# Patient Record
Sex: Female | Born: 1985 | Race: White | Hispanic: No | Marital: Married | Smoking: Never smoker
Health system: Southern US, Community
[De-identification: ages and names within clinical notes are randomized; demographics above are authoritative.]

## PROBLEM LIST (undated history)

## (undated) ENCOUNTER — Inpatient Hospital Stay (HOSPITAL_COMMUNITY): Payer: Self-pay

## (undated) DIAGNOSIS — D649 Anemia, unspecified: Secondary | ICD-10-CM

## (undated) DIAGNOSIS — O139 Gestational [pregnancy-induced] hypertension without significant proteinuria, unspecified trimester: Secondary | ICD-10-CM

## (undated) HISTORY — PX: NO PAST SURGERIES: SHX2092

## (undated) HISTORY — DX: Gestational (pregnancy-induced) hypertension without significant proteinuria, unspecified trimester: O13.9

---

## 2013-07-08 NOTE — L&D Delivery Note (Signed)
SVD of VMI at 1811 on 03/17/14.  EBL 400cc.  APGARs 8,9.  Placenta to L&D. Head delivered LOA; compound presentation with hand.  Body followed atraumatically.  Mouth and nose bulb suctioned.  Cord clamped, cut and baby to abdomen.  Placenta delivered S/I/3VC and handed off for cord blood donation.  Fundus firmed with pitocin and massage.  2nd degree perineal lac repaired with 3-0 Rapide in the normal fashion.  Mom and baby stable.  Will continue postpartum magnesium sulfate x 12-24 hours for seizure prophylaxis.    Mitchel Honour, DO

## 2013-09-08 LAB — OB RESULTS CONSOLE RPR: RPR: NONREACTIVE

## 2013-09-08 LAB — OB RESULTS CONSOLE RUBELLA ANTIBODY, IGM: Rubella: IMMUNE

## 2013-09-08 LAB — OB RESULTS CONSOLE ABO/RH: RH Type: POSITIVE

## 2013-09-08 LAB — OB RESULTS CONSOLE HEPATITIS B SURFACE ANTIGEN: Hepatitis B Surface Ag: NEGATIVE

## 2013-09-08 LAB — OB RESULTS CONSOLE HIV ANTIBODY (ROUTINE TESTING): HIV: NONREACTIVE

## 2013-09-08 LAB — OB RESULTS CONSOLE ANTIBODY SCREEN: ANTIBODY SCREEN: NEGATIVE

## 2014-01-19 ENCOUNTER — Inpatient Hospital Stay (HOSPITAL_COMMUNITY): Admission: AD | Admit: 2014-01-19 | Payer: Self-pay | Source: Ambulatory Visit | Admitting: Obstetrics & Gynecology

## 2014-02-25 ENCOUNTER — Encounter (HOSPITAL_COMMUNITY): Payer: Self-pay | Admitting: *Deleted

## 2014-02-25 ENCOUNTER — Inpatient Hospital Stay (HOSPITAL_COMMUNITY)
Admission: AD | Admit: 2014-02-25 | Discharge: 2014-02-25 | Disposition: A | Discharge status: Home / Self Care | Payer: BC Managed Care – PPO | Source: Ambulatory Visit | Attending: Obstetrics and Gynecology | Admitting: Obstetrics and Gynecology

## 2014-02-25 DIAGNOSIS — O139 Gestational [pregnancy-induced] hypertension without significant proteinuria, unspecified trimester: Secondary | ICD-10-CM | POA: Diagnosis not present

## 2014-02-25 DIAGNOSIS — R030 Elevated blood-pressure reading, without diagnosis of hypertension: Secondary | ICD-10-CM | POA: Diagnosis present

## 2014-02-25 DIAGNOSIS — O133 Gestational [pregnancy-induced] hypertension without significant proteinuria, third trimester: Secondary | ICD-10-CM

## 2014-02-25 LAB — CBC
HCT: 32.3 % — ABNORMAL LOW (ref 36.0–46.0)
Hemoglobin: 10.8 g/dL — ABNORMAL LOW (ref 12.0–15.0)
MCH: 29.4 pg (ref 26.0–34.0)
MCHC: 33.4 g/dL (ref 30.0–36.0)
MCV: 88 fL (ref 78.0–100.0)
PLATELETS: 143 10*3/uL — AB (ref 150–400)
RBC: 3.67 MIL/uL — ABNORMAL LOW (ref 3.87–5.11)
RDW: 13.9 % (ref 11.5–15.5)
WBC: 8.5 10*3/uL (ref 4.0–10.5)

## 2014-02-25 LAB — LACTATE DEHYDROGENASE: LDH: 154 U/L (ref 94–250)

## 2014-02-25 LAB — COMPREHENSIVE METABOLIC PANEL
ALT: 7 U/L (ref 0–35)
AST: 11 U/L (ref 0–37)
Albumin: 2.6 g/dL — ABNORMAL LOW (ref 3.5–5.2)
Alkaline Phosphatase: 125 U/L — ABNORMAL HIGH (ref 39–117)
Anion gap: 14 (ref 5–15)
BUN: 7 mg/dL (ref 6–23)
CO2: 21 mEq/L (ref 19–32)
Calcium: 8.5 mg/dL (ref 8.4–10.5)
Chloride: 99 mEq/L (ref 96–112)
Creatinine, Ser: 0.5 mg/dL (ref 0.50–1.10)
GFR calc non Af Amer: >90 mL/min (ref >90)
GLUCOSE: 75 mg/dL (ref 70–99)
Potassium: 4 mEq/L (ref 3.7–5.3)
SODIUM: 134 meq/L — AB (ref 137–147)
TOTAL PROTEIN: 6.7 g/dL (ref 6.0–8.3)
Total Bilirubin: <0.2 mg/dL — ABNORMAL LOW (ref 0.3–1.2)

## 2014-02-25 LAB — PROTEIN / CREATININE RATIO, URINE
CREATININE, URINE: 94.76 mg/dL
Protein Creatinine Ratio: 0.1 (ref 0.00–0.15)
Total Protein, Urine: 9.8 mg/dL

## 2014-02-25 LAB — URIC ACID: URIC ACID, SERUM: 4.5 mg/dL (ref 2.4–7.0)

## 2014-02-25 NOTE — Discharge Instructions (Signed)

## 2014-02-25 NOTE — MAU Provider Note (Signed)
Chief Complaint:  elevated blood pressure    First Provider Initiated Contact with Patient 02/25/14 1404      HPI: Jordan Preston is a 28 y.o. G1P0 at [redacted]w[redacted]d who presents to maternity admissions sent from the office for elevated BP. She reports she had some elevated BP before this pregnancy and has had a few borderline blood pressures of 130s/90s in the last few weeks.  She also has swelling in both her feet and ankles, increasing this week. 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  She reports good fetal movement, denies h/a, epigastric pain, or visual disturbances.  She denies LOF, vaginal bleeding, vaginal itching/burning, urinary symptoms, dizziness, n/v, or fever/chills.     Past Medical History: History reviewed. No pertinent past medical history.  Past obstetric history: OB History  Gravida Para Term Preterm AB SAB TAB Ectopic Multiple Living  1             # Outcome Date GA Lbr Len/2nd Weight Sex Delivery Anes PTL Lv  1 CUR               Past Surgical History: History reviewed. No pertinent past surgical history.  Family History: History reviewed. No pertinent family history.  Social History: History  Substance Use Topics  . Smoking status: Never Smoker   . Smokeless tobacco: Never Used  . Alcohol Use: No    Allergies: No Known Allergies  Meds:  Prescriptions prior to admission  Medication Sig Dispense Refill  . Prenatal Vit-Fe Fumarate-FA (PRENATAL MULTIVITAMIN) TABS tablet Take 1 tablet by mouth daily at 12 noon.        ROS: Pertinent findings in history of present illness.  Physical Exam  Blood pressure 140/91, pulse 81, temperature 98.3 F (36.8 C), temperature source Oral, resp. rate 18, height 5' 4.5" (1.638 m), weight 81.466 kg (179 lb 9.6 oz), SpO2 100.00%. GENERAL: Well-developed, well-nourished female in no acute distress.  HEENT: normocephalic HEART: normal rate RESP: normal effort ABDOMEN: Soft,  non-tender, gravid appropriate for gestational age EXTREMITIES: Nontender, 2+ pitting edema BLE NEURO: alert and oriented SPECULUM EXAM: NEFG, physiologic discharge, no blood, cervix clean    FHT:  Baseline 125, moderate variability, accelerations present, no decelerations Contractions: None on toco or to palpation   Labs: Results for orders placed during the hospital encounter of 02/25/14 (from the past 24 hour(s))  PROTEIN / CREATININE RATIO, URINE     Status: None   Collection Time    02/25/14  1:20 PM      Result Value Ref Range   Creatinine, Urine 94.76     Total Protein, Urine 9.8     PROTEIN CREATININE RATIO 0.10  0.00 - 0.15  CBC     Status: Abnormal   Collection Time    02/25/14  2:12 PM      Result Value Ref Range   WBC 8.5  4.0 - 10.5 K/uL   RBC 3.67 (*) 3.87 - 5.11 MIL/uL   Hemoglobin 10.8 (*) 12.0 - 15.0 g/dL   HCT 30.8 (*) 65.7 - 84.6 %   MCV 88.0  78.0 - 100.0 fL   MCH 29.4  26.0 - 34.0 pg   MCHC 33.4  30.0 - 36.0 g/dL   RDW 96.2  95.2 - 84.1 %   Platelets 143 (*) 150 - 400 K/uL  COMPREHENSIVE METABOLIC PANEL     Status: Abnormal   Collection Time    02/25/14  2:12 PM      Result Value Ref Range   Sodium 134 (*) 137 - 147 mEq/L   Potassium 4.0  3.7 - 5.3 mEq/L   Chloride 99  96 - 112 mEq/L   CO2 21  19 - 32 mEq/L   Glucose, Bld 75  70 - 99 mg/dL   BUN 7  6 - 23 mg/dL   Creatinine, Ser 2.720.50  0.50 - 1.10 mg/dL   Calcium 8.5  8.4 - 53.610.5 mg/dL   Total Protein 6.7  6.0 - 8.3 g/dL   Albumin 2.6 (*) 3.5 - 5.2 g/dL   AST 11  0 - 37 U/L   ALT 7  0 - 35 U/L   Alkaline Phosphatase 125 (*) 39 - 117 U/L   Total Bilirubin <0.2 (*) 0.3 - 1.2 mg/dL   GFR calc non Af Amer >90  >90 mL/min   GFR calc Af Amer >90  >90 mL/min   Anion gap 14  5 - 15  LACTATE DEHYDROGENASE     Status: None   Collection Time    02/25/14  2:12 PM      Result Value Ref Range   LDH 154  94 - 250 U/L  URIC ACID     Status: None   Collection Time    02/25/14  2:12 PM      Result Value  Ref Range   Uric Acid, Serum 4.5  2.4 - 7.0 mg/dL    Assessment: 1. Gestational hypertension w/o significant proteinuria in 3rd trimester     Plan: Consult Dr Renaldo FiddlerAdkins Discharge home Return to MAU tomorrow for BP check/repeat labs Use compression socks for swelling, especially if standing/sitting for long periods   Follow-up Information   [facility] (Tomorrow for blood pressure check.)    Contact information:   [address] [id number] [facility] [place] [id number] [phone]       Medication List         prenatal multivitamin Tabs tablet  Take 1 tablet by mouth daily at 12 noon.        Sharen CounterLisa Leftwich-Kirby Certified Nurse-Midwife 02/25/2014 3:29 PM

## 2014-02-25 NOTE — MAU Note (Signed)
Urine in lab 

## 2014-02-25 NOTE — MAU Note (Signed)
[facility] pressures. Denies headache, change in vision, or epigastric pain. Denies LOF, VB, or contractions. +FM. Does report some increase in swelling particularly lower extremities than normal for her. States BP in office was 140/92.

## 2014-02-26 ENCOUNTER — Encounter (HOSPITAL_COMMUNITY): Payer: Self-pay

## 2014-02-26 ENCOUNTER — Inpatient Hospital Stay (HOSPITAL_COMMUNITY)
Admission: AD | Admit: 2014-02-26 | Discharge: 2014-02-26 | Disposition: A | Discharge status: Home / Self Care | Payer: BC Managed Care – PPO | Source: Ambulatory Visit | Attending: Obstetrics and Gynecology | Admitting: Obstetrics and Gynecology

## 2014-02-26 DIAGNOSIS — O133 Gestational [pregnancy-induced] hypertension without significant proteinuria, third trimester: Secondary | ICD-10-CM

## 2014-02-26 DIAGNOSIS — O139 Gestational [pregnancy-induced] hypertension without significant proteinuria, unspecified trimester: Secondary | ICD-10-CM | POA: Diagnosis present

## 2014-02-26 HISTORY — DX: Anemia, unspecified: D64.9

## 2014-02-26 LAB — COMPREHENSIVE METABOLIC PANEL
ALT: 9 U/L (ref 0–35)
AST: 13 U/L (ref 0–37)
Albumin: 2.6 g/dL — ABNORMAL LOW (ref 3.5–5.2)
Alkaline Phosphatase: 140 U/L — ABNORMAL HIGH (ref 39–117)
Anion gap: 13 (ref 5–15)
BUN: 6 mg/dL (ref 6–23)
CO2: 22 mEq/L (ref 19–32)
Calcium: 8.6 mg/dL (ref 8.4–10.5)
Chloride: 102 mEq/L (ref 96–112)
Creatinine, Ser: 0.6 mg/dL (ref 0.50–1.10)
GFR calc Af Amer: >90 mL/min (ref >90)
GFR calc non Af Amer: >90 mL/min (ref >90)
Glucose, Bld: 108 mg/dL — ABNORMAL HIGH (ref 70–99)
Potassium: 4.1 mEq/L (ref 3.7–5.3)
Sodium: 137 mEq/L (ref 137–147)
Total Bilirubin: 0.2 mg/dL — ABNORMAL LOW (ref 0.3–1.2)
Total Protein: 6.5 g/dL (ref 6.0–8.3)

## 2014-02-26 LAB — CBC
HCT: 32.8 % — ABNORMAL LOW (ref 36.0–46.0)
Hemoglobin: 11.2 g/dL — ABNORMAL LOW (ref 12.0–15.0)
MCH: 30.1 pg (ref 26.0–34.0)
MCHC: 34.1 g/dL (ref 30.0–36.0)
MCV: 88.2 fL (ref 78.0–100.0)
Platelets: 149 10*3/uL — ABNORMAL LOW (ref 150–400)
RBC: 3.72 MIL/uL — ABNORMAL LOW (ref 3.87–5.11)
RDW: 13.9 % (ref 11.5–15.5)
WBC: 8.7 10*3/uL (ref 4.0–10.5)

## 2014-02-26 LAB — PROTEIN / CREATININE RATIO, URINE
CREATININE, URINE: 90.79 mg/dL
PROTEIN CREATININE RATIO: 0.15 (ref 0.00–0.15)
Total Protein, Urine: 13.6 mg/dL

## 2014-02-26 LAB — URIC ACID: Uric Acid, Serum: 4.6 mg/dL (ref 2.4–7.0)

## 2014-02-26 LAB — LACTATE DEHYDROGENASE: LDH: 164 U/L (ref 94–250)

## 2014-02-26 NOTE — MAU Provider Note (Signed)
History     CSN: [id number]  Arrival date and time: 02/26/14 1315   None     Chief Complaint  Patient presents with  . Hypertension   HPI  Ms. Ajani Schnieders is 28 y.o. female G1P0 at 48w1dwho presents to MAU for repeat PIH labs and serial BP checks. The patient was sent from the office yesterday to MAU for labs and BP checks. The patient was sent home and instructed to come back today. +fetal movement, denies vaginal bleeding or leaking of fluid.   OB History   Grav Para Term Preterm Abortions TAB SAB Ect Mult Living   1               Past Medical History  Diagnosis Date  . Anemia     Past Surgical History  Procedure Laterality Date  . No past surgeries      History reviewed. No pertinent family history.  History  Substance Use Topics  . Smoking status: Never Smoker   . Smokeless tobacco: Never Used  . Alcohol Use: No    Allergies: No Known Allergies  Prescriptions prior to admission  Medication Sig Dispense Refill  . acetaminophen (TYLENOL) 500 MG tablet Take 500 mg by mouth every 6 (six) hours as needed for mild pain.      . calcium carbonate (TUMS - DOSED IN MG ELEMENTAL CALCIUM) 500 MG chewable tablet Chew 2 tablets by mouth at bedtime as needed for indigestion or heartburn.      . Prenatal Vit-Fe Fumarate-FA (PRENATAL MULTIVITAMIN) TABS tablet Take 1 tablet by mouth daily.        Results for orders placed during the hospital encounter of 02/26/14 (from the past 48 hour(s))  PROTEIN / CREATININE RATIO, URINE     Status: None   Collection Time    02/26/14  1:23 PM      Result Value Ref Range   Creatinine, Urine 90.79     Total Protein, Urine 13.6     Comment: NO NORMAL RANGE ESTABLISHED FOR THIS TEST   PROTEIN CREATININE RATIO 0.15  0.00 - 0.15  CBC     Status: Abnormal   Collection Time    02/26/14  1:40 PM      Result Value Ref Range   WBC 8.7  4.0 - 10.5 K/uL   RBC 3.72 (*) 3.87 - 5.11 MIL/uL   Hemoglobin 11.2 (*) 12.0 - 15.0 g/dL   HCT 32.8 (*)  36.0 - 46.0 %   MCV 88.2  78.0 - 100.0 fL   MCH 30.1  26.0 - 34.0 pg   MCHC 34.1  30.0 - 36.0 g/dL   RDW 13.9  11.5 - 15.5 %   Platelets 149 (*) 150 - 400 K/uL  COMPREHENSIVE METABOLIC PANEL     Status: Abnormal   Collection Time    02/26/14  1:40 PM      Result Value Ref Range   Sodium 137  137 - 147 mEq/L   Potassium 4.1  3.7 - 5.3 mEq/L   Chloride 102  96 - 112 mEq/L   CO2 22  19 - 32 mEq/L   Glucose, Bld 108 (*) 70 - 99 mg/dL   BUN 6  6 - 23 mg/dL   Creatinine, Ser 0.60  0.50 - 1.10 mg/dL   Calcium 8.6  8.4 - 10.5 mg/dL   Total Protein 6.5  6.0 - 8.3 g/dL   Albumin 2.6 (*) 3.5 - 5.2 g/dL   AST 13  0 - 37 U/L   ALT 9  0 - 35 U/L   Alkaline Phosphatase 140 (*) 39 - 117 U/L   Total Bilirubin 0.2 (*) 0.3 - 1.2 mg/dL   GFR calc non Af Amer >90  >90 mL/min   GFR calc Af Amer >90  >90 mL/min   Comment: (NOTE)     The eGFR has been calculated using the CKD EPI equation.     This calculation has not been validated in all clinical situations.     eGFR's persistently <90 mL/min signify possible Chronic Kidney     Disease.   Anion gap 13  5 - 15  LACTATE DEHYDROGENASE     Status: None   Collection Time    02/26/14  1:40 PM      Result Value Ref Range   LDH 164  94 - 250 U/L  URIC ACID     Status: None   Collection Time    02/26/14  1:40 PM      Result Value Ref Range   Uric Acid, Serum 4.6  2.4 - 7.0 mg/dL       Review of Systems  Eyes: Negative for blurred vision.  Cardiovascular: Positive for leg swelling (bilateral legs/feet).  Gastrointestinal: Negative for nausea, vomiting and abdominal pain.  Genitourinary: Negative for dysuria, urgency, frequency and hematuria.  Musculoskeletal: Positive for back pain.  Neurological: Negative for headaches.   Physical Exam   Blood pressure 126/83, pulse 89, temperature 99.1 F (37.3 C), temperature source Oral, resp. rate 16, height 5' 4.5" (1.638 m), weight 82.271 kg (181 lb 6 oz).  Physical Exam  Constitutional: She is  oriented to person, place, and time. She appears well-developed and well-nourished. No distress.  HENT:  Head: Normocephalic.  Eyes: Pupils are equal, round, and reactive to light.  Neck: Neck supple.  Cardiovascular: Normal rate and normal heart sounds.   Respiratory: Effort normal and breath sounds normal.  GI: Soft. Normal appearance. There is no tenderness.  Musculoskeletal: Normal range of motion.       Right ankle: She exhibits no swelling.       Left ankle: She exhibits no swelling.  Neurological: She is alert and oriented to person, place, and time. She has normal reflexes.  Skin: Skin is warm. She is not diaphoretic.  Psychiatric: Her behavior is normal.    Fetal Tracing: Baseline: 135 bpm  Variability: Moderate  Accelerations: 15x15 Decelerations: None Toco: none   MAU Course  Procedures None  MDM PIH labs UA Protein creatine urine ratio  Discussed labs with Dr. Julien Girt   Assessment and Plan   A: Pregnancy induced hypertension  P: Discharge home in stable condition Follow up in the office next week Preeclampsia precautions discussed Return to MAU as needed  [place], NP  02/26/2014, 2:03 PM

## 2014-02-26 NOTE — MAU Note (Signed)
Pt seen in MAU yesterday for PIH eval. Told to return tomorrow for repeat bloodwork and serial bp's.

## 2014-02-26 NOTE — Discharge Instructions (Signed)

## 2014-03-07 LAB — OB RESULTS CONSOLE GBS: GBS: NEGATIVE

## 2014-03-16 ENCOUNTER — Encounter (HOSPITAL_COMMUNITY): Payer: Self-pay

## 2014-03-16 ENCOUNTER — Inpatient Hospital Stay (HOSPITAL_COMMUNITY)
Admission: AD | Admit: 2014-03-16 | Discharge: 2014-03-19 | DRG: 775 | Disposition: A | Discharge status: Home / Self Care | Payer: BC Managed Care – PPO | Source: Ambulatory Visit | Attending: Obstetrics & Gynecology | Admitting: Obstetrics & Gynecology

## 2014-03-16 DIAGNOSIS — D649 Anemia, unspecified: Secondary | ICD-10-CM | POA: Diagnosis present

## 2014-03-16 DIAGNOSIS — O139 Gestational [pregnancy-induced] hypertension without significant proteinuria, unspecified trimester: Secondary | ICD-10-CM | POA: Diagnosis present

## 2014-03-16 DIAGNOSIS — O4100X0 Oligohydramnios, unspecified trimester, not applicable or unspecified: Principal | ICD-10-CM | POA: Diagnosis present

## 2014-03-16 DIAGNOSIS — O328XX0 Maternal care for other malpresentation of fetus, not applicable or unspecified: Secondary | ICD-10-CM | POA: Diagnosis present

## 2014-03-16 DIAGNOSIS — O9902 Anemia complicating childbirth: Secondary | ICD-10-CM | POA: Diagnosis present

## 2014-03-16 DIAGNOSIS — O133 Gestational [pregnancy-induced] hypertension without significant proteinuria, third trimester: Secondary | ICD-10-CM

## 2014-03-16 LAB — CBC
HCT: 32.1 % — ABNORMAL LOW (ref 36.0–46.0)
Hemoglobin: 10.9 g/dL — ABNORMAL LOW (ref 12.0–15.0)
MCH: 29.9 pg (ref 26.0–34.0)
MCHC: 34 g/dL (ref 30.0–36.0)
MCV: 87.9 fL (ref 78.0–100.0)
Platelets: 146 10*3/uL — ABNORMAL LOW (ref 150–400)
RBC: 3.65 MIL/uL — ABNORMAL LOW (ref 3.87–5.11)
RDW: 14.4 % (ref 11.5–15.5)
WBC: 9.2 10*3/uL (ref 4.0–10.5)

## 2014-03-16 LAB — COMPREHENSIVE METABOLIC PANEL
ALT: 8 U/L (ref 0–35)
AST: 14 U/L (ref 0–37)
Albumin: 2.4 g/dL — ABNORMAL LOW (ref 3.5–5.2)
Alkaline Phosphatase: 168 U/L — ABNORMAL HIGH (ref 39–117)
Anion gap: 8 (ref 5–15)
BUN: 7 mg/dL (ref 6–23)
CALCIUM: 8.6 mg/dL (ref 8.4–10.5)
CO2: 26 meq/L (ref 19–32)
CREATININE: 0.52 mg/dL (ref 0.50–1.10)
Chloride: 101 mEq/L (ref 96–112)
GLUCOSE: 79 mg/dL (ref 70–99)
Potassium: 4 mEq/L (ref 3.7–5.3)
Sodium: 135 mEq/L — ABNORMAL LOW (ref 137–147)
TOTAL PROTEIN: 6.6 g/dL (ref 6.0–8.3)
Total Bilirubin: <0.2 mg/dL — ABNORMAL LOW (ref 0.3–1.2)

## 2014-03-16 LAB — URIC ACID: URIC ACID, SERUM: 4.5 mg/dL (ref 2.4–7.0)

## 2014-03-16 MED ORDER — ACETAMINOPHEN 325 MG PO TABS: 650.0000 mg | ORAL_TABLET | ORAL | Status: DC | PRN

## 2014-03-16 MED ORDER — OXYCODONE-ACETAMINOPHEN 5-325 MG PO TABS: 1.0000 | ORAL_TABLET | ORAL | Status: DC | PRN

## 2014-03-16 MED ORDER — OXYTOCIN 40 UNITS IN LACTATED RINGERS INFUSION - SIMPLE MED
62.5000 mL/h | INTRAVENOUS | Status: DC
  Administered 2014-03-17: 999 mL/h via INTRAVENOUS

## 2014-03-16 MED ORDER — CITRIC ACID-SODIUM CITRATE 334-500 MG/5ML PO SOLN
30.0000 mL | ORAL | Status: DC | PRN
  Administered 2014-03-17: 30 mL via ORAL
  Filled 2014-03-16: qty 15

## 2014-03-16 MED ORDER — ONDANSETRON HCL 4 MG/2ML IJ SOLN
4.0000 mg | Freq: Four times a day (QID) | INTRAMUSCULAR | Status: DC | PRN
  Administered 2014-03-17: 4 mg via INTRAVENOUS
  Filled 2014-03-16: qty 2

## 2014-03-16 MED ORDER — LACTATED RINGERS IV SOLN: 500.0000 mL | INTRAVENOUS | Status: DC | PRN

## 2014-03-16 MED ORDER — OXYCODONE-ACETAMINOPHEN 5-325 MG PO TABS: 2.0000 | ORAL_TABLET | ORAL | Status: DC | PRN

## 2014-03-16 MED ORDER — MISOPROSTOL 25 MCG QUARTER TABLET
25.0000 ug | ORAL_TABLET | ORAL | Status: DC | PRN
  Administered 2014-03-16 – 2014-03-17 (×2): 25 ug via VAGINAL
  Filled 2014-03-16: qty 1
  Filled 2014-03-16 (×2): qty 0.25

## 2014-03-16 MED ORDER — TERBUTALINE SULFATE 1 MG/ML IJ SOLN: 0.2500 mg | Freq: Once | INTRAMUSCULAR | Status: AC | PRN

## 2014-03-16 MED ORDER — LABETALOL HCL 100 MG PO TABS
100.0000 mg | ORAL_TABLET | Freq: Once | ORAL | Status: AC
  Administered 2014-03-16: 100 mg via ORAL
  Filled 2014-03-16: qty 1

## 2014-03-16 MED ORDER — LACTATED RINGERS IV SOLN
INTRAVENOUS | Status: DC
  Administered 2014-03-16 – 2014-03-17 (×3): via INTRAVENOUS

## 2014-03-16 MED ORDER — ZOLPIDEM TARTRATE 5 MG PO TABS
5.0000 mg | ORAL_TABLET | Freq: Every evening | ORAL | Status: DC | PRN
  Administered 2014-03-16: 5 mg via ORAL
  Filled 2014-03-16: qty 1

## 2014-03-16 MED ORDER — FLEET ENEMA 7-19 GM/118ML RE ENEM: 1.0000 | ENEMA | Freq: Every day | RECTAL | Status: DC | PRN

## 2014-03-16 MED ORDER — OXYTOCIN BOLUS FROM INFUSION: 500.0000 mL | INTRAVENOUS | Status: DC

## 2014-03-16 MED ORDER — LIDOCAINE HCL (PF) 1 % IJ SOLN
30.0000 mL | INTRAMUSCULAR | Status: DC | PRN
  Filled 2014-03-16: qty 30

## 2014-03-17 ENCOUNTER — Encounter (HOSPITAL_COMMUNITY): Payer: Self-pay | Admitting: *Deleted

## 2014-03-17 ENCOUNTER — Encounter (HOSPITAL_COMMUNITY): Payer: BC Managed Care – PPO | Admitting: Anesthesiology

## 2014-03-17 ENCOUNTER — Inpatient Hospital Stay (HOSPITAL_COMMUNITY): Payer: BC Managed Care – PPO | Admitting: Anesthesiology

## 2014-03-17 DIAGNOSIS — O139 Gestational [pregnancy-induced] hypertension without significant proteinuria, unspecified trimester: Secondary | ICD-10-CM | POA: Diagnosis present

## 2014-03-17 LAB — CBC
HCT: 33.9 % — ABNORMAL LOW (ref 36.0–46.0)
HCT: 33.6 % — ABNORMAL LOW (ref 36.0–46.0)
HEMOGLOBIN: 11.5 g/dL — AB (ref 12.0–15.0)
Hemoglobin: 11.7 g/dL — ABNORMAL LOW (ref 12.0–15.0)
MCH: 30.5 pg (ref 26.0–34.0)
MCH: 30.1 pg (ref 26.0–34.0)
MCHC: 34.5 g/dL (ref 30.0–36.0)
MCHC: 34.2 g/dL (ref 30.0–36.0)
MCV: 88.3 fL (ref 78.0–100.0)
MCV: 88 fL (ref 78.0–100.0)
PLATELETS: 160 10*3/uL (ref 150–400)
Platelets: 139 10*3/uL — ABNORMAL LOW (ref 150–400)
RBC: 3.84 MIL/uL — ABNORMAL LOW (ref 3.87–5.11)
RBC: 3.82 MIL/uL — ABNORMAL LOW (ref 3.87–5.11)
RDW: 14.6 % (ref 11.5–15.5)
RDW: 14.6 % (ref 11.5–15.5)
WBC: 11.6 10*3/uL — AB (ref 4.0–10.5)
WBC: 13.8 10*3/uL — ABNORMAL HIGH (ref 4.0–10.5)

## 2014-03-17 LAB — RPR

## 2014-03-17 MED ORDER — PHENYLEPHRINE 40 MCG/ML (10ML) SYRINGE FOR IV PUSH (FOR BLOOD PRESSURE SUPPORT)
80.0000 ug | PREFILLED_SYRINGE | INTRAVENOUS | Status: DC | PRN
  Filled 2014-03-17: qty 2

## 2014-03-17 MED ORDER — FENTANYL 2.5 MCG/ML BUPIVACAINE 1/10 % EPIDURAL INFUSION (WH - ANES): 14.0000 mL/h | INTRAMUSCULAR | Status: DC | PRN

## 2014-03-17 MED ORDER — ZOLPIDEM TARTRATE 5 MG PO TABS: 5.0000 mg | ORAL_TABLET | Freq: Every evening | ORAL | Status: DC | PRN

## 2014-03-17 MED ORDER — DIBUCAINE 1 % RE OINT: 1.0000 "application " | TOPICAL_OINTMENT | RECTAL | Status: DC | PRN

## 2014-03-17 MED ORDER — DIPHENHYDRAMINE HCL 50 MG/ML IJ SOLN: 12.5000 mg | INTRAMUSCULAR | Status: DC | PRN

## 2014-03-17 MED ORDER — OXYCODONE-ACETAMINOPHEN 5-325 MG PO TABS
2.0000 | ORAL_TABLET | ORAL | Status: DC | PRN
Start: 2014-03-17 — End: 2014-03-19

## 2014-03-17 MED ORDER — TETANUS-DIPHTH-ACELL PERTUSSIS 5-2.5-18.5 LF-MCG/0.5 IM SUSP
0.5000 mL | Freq: Once | INTRAMUSCULAR | Status: AC
  Administered 2014-03-18: 0.5 mL via INTRAMUSCULAR
  Filled 2014-03-17 (×2): qty 0.5

## 2014-03-17 MED ORDER — BENZOCAINE-MENTHOL 20-0.5 % EX AERO
1.0000 "application " | INHALATION_SPRAY | CUTANEOUS | Status: DC | PRN
  Administered 2014-03-17: 1 via TOPICAL
  Filled 2014-03-17: qty 56

## 2014-03-17 MED ORDER — ONDANSETRON HCL 4 MG PO TABS: 4.0000 mg | ORAL_TABLET | ORAL | Status: DC | PRN

## 2014-03-17 MED ORDER — LANOLIN HYDROUS EX OINT: TOPICAL_OINTMENT | CUTANEOUS | Status: DC | PRN

## 2014-03-17 MED ORDER — EPHEDRINE 5 MG/ML INJ
10.0000 mg | INTRAVENOUS | Status: DC | PRN
  Filled 2014-03-17: qty 2

## 2014-03-17 MED ORDER — OXYCODONE-ACETAMINOPHEN 5-325 MG PO TABS
1.0000 | ORAL_TABLET | ORAL | Status: DC | PRN
  Administered 2014-03-17 – 2014-03-18 (×2): 1 via ORAL
  Filled 2014-03-17 (×2): qty 1

## 2014-03-17 MED ORDER — ONDANSETRON HCL 4 MG/2ML IJ SOLN: 4.0000 mg | INTRAMUSCULAR | Status: DC | PRN

## 2014-03-17 MED ORDER — LACTATED RINGERS IV SOLN
INTRAVENOUS | Status: DC
  Administered 2014-03-17 – 2014-03-18 (×2): via INTRAVENOUS

## 2014-03-17 MED ORDER — MAGNESIUM SULFATE BOLUS VIA INFUSION
6.0000 g | Freq: Once | INTRAVENOUS | Status: AC
  Administered 2014-03-17: 6 g via INTRAVENOUS
  Filled 2014-03-17: qty 500

## 2014-03-17 MED ORDER — MAGNESIUM SULFATE 40 G IN LACTATED RINGERS - SIMPLE
2.0000 g/h | INTRAVENOUS | Status: DC
  Administered 2014-03-18: 2 g/h via INTRAVENOUS
  Filled 2014-03-17 (×2): qty 500

## 2014-03-17 MED ORDER — BUTORPHANOL TARTRATE 1 MG/ML IJ SOLN: 1.0000 mg | INTRAMUSCULAR | Status: DC | PRN

## 2014-03-17 MED ORDER — LIDOCAINE HCL (PF) 1 % IJ SOLN
INTRAMUSCULAR | Status: DC | PRN
  Administered 2014-03-17 (×2): 5 mL

## 2014-03-17 MED ORDER — OXYTOCIN 40 UNITS IN LACTATED RINGERS INFUSION - SIMPLE MED
1.0000 m[IU]/min | INTRAVENOUS | Status: DC
  Administered 2014-03-17: 2 m[IU]/min via INTRAVENOUS
  Filled 2014-03-17: qty 1000

## 2014-03-17 MED ORDER — SENNOSIDES-DOCUSATE SODIUM 8.6-50 MG PO TABS
2.0000 | ORAL_TABLET | ORAL | Status: DC
  Administered 2014-03-17 – 2014-03-18 (×2): 2 via ORAL
  Filled 2014-03-17 (×2): qty 2

## 2014-03-17 MED ORDER — LACTATED RINGERS IV SOLN: 500.0000 mL | Freq: Once | INTRAVENOUS | Status: DC

## 2014-03-17 MED ORDER — TERBUTALINE SULFATE 1 MG/ML IJ SOLN: 0.2500 mg | Freq: Once | INTRAMUSCULAR | Status: DC | PRN

## 2014-03-17 MED ORDER — IBUPROFEN 600 MG PO TABS
600.0000 mg | ORAL_TABLET | Freq: Four times a day (QID) | ORAL | Status: DC
  Administered 2014-03-17 – 2014-03-19 (×6): 600 mg via ORAL
  Filled 2014-03-17 (×6): qty 1

## 2014-03-17 MED ORDER — PHENYLEPHRINE 40 MCG/ML (10ML) SYRINGE FOR IV PUSH (FOR BLOOD PRESSURE SUPPORT)
PREFILLED_SYRINGE | INTRAVENOUS | Status: AC
  Filled 2014-03-17: qty 10

## 2014-03-17 MED ORDER — DIPHENHYDRAMINE HCL 25 MG PO CAPS: 25.0000 mg | ORAL_CAPSULE | Freq: Four times a day (QID) | ORAL | Status: DC | PRN

## 2014-03-17 MED ORDER — PRENATAL MULTIVITAMIN CH
1.0000 | ORAL_TABLET | Freq: Every day | ORAL | Status: DC
  Administered 2014-03-18: 1 via ORAL
  Filled 2014-03-17: qty 1

## 2014-03-17 MED ORDER — SIMETHICONE 80 MG PO CHEW: 80.0000 mg | CHEWABLE_TABLET | ORAL | Status: DC | PRN

## 2014-03-17 MED ORDER — FENTANYL 2.5 MCG/ML BUPIVACAINE 1/10 % EPIDURAL INFUSION (WH - ANES)
INTRAMUSCULAR | Status: AC
  Administered 2014-03-17: 14 mL/h via EPIDURAL
  Filled 2014-03-17: qty 125

## 2014-03-17 MED ORDER — WITCH HAZEL-GLYCERIN EX PADS: 1.0000 "application " | MEDICATED_PAD | CUTANEOUS | Status: DC | PRN

## 2014-03-17 NOTE — Anesthesia Preprocedure Evaluation (Signed)
Anesthesia Evaluation  Patient identified by MRN, date of birth, ID band Patient awake    Reviewed: Allergy & Precautions, H&P , Patient's Chart, lab work & pertinent test results  Airway Mallampati: II  TM Distance: >3 FB Neck ROM: full    Dental   Pulmonary  breath sounds clear to auscultation        Cardiovascular hypertension, Rhythm:regular Rate:Normal     Neuro/Psych    GI/Hepatic   Endo/Other    Renal/GU      Musculoskeletal   Abdominal   Peds  Hematology  (+) anemia ,   Anesthesia Other Findings   Reproductive/Obstetrics (+) Pregnancy                            Anesthesia Physical Anesthesia Plan  ASA: III  Anesthesia Plan: Epidural   Post-op Pain Management:    Induction:   Airway Management Planned:   Additional Equipment:   Intra-op Plan:   Post-operative Plan:   Informed Consent: I have reviewed the patients History and Physical, chart, labs and discussed the procedure including the risks, benefits and alternatives for the proposed anesthesia with the patient or authorized representative who has indicated his/her understanding and acceptance.     Plan Discussed with:   Anesthesia Plan Comments:        Anesthesia Quick Evaluation  

## 2014-03-17 NOTE — Plan of Care (Signed)
Problem: Discharge Progression Outcomes Goal: Transfer to Post Partum room/infant to nursery Outcome: Completed/Met Date Met:  03/17/14 To AICU Goal: Patient transferred to postpartum unit Outcome: Completed/Met Date Met:  03/17/14 To AICU

## 2014-03-17 NOTE — H&P (Signed)
Jordan Preston is a 28 y.o. female presenting for induction of labor secondary to oligohydramnios.  Patient has been followed in the office for [facility]; HELLP labs wnl.  GBS negative.  No HA, CP/SOB, RUQ pain, or vision change.    Maternal Medical History:  Fetal activity: Perceived fetal activity is normal.   Last perceived fetal movement was within the past hour.    Prenatal complications: no prenatal complications Prenatal Complications - Diabetes: none.    OB History   Grav Para Term Preterm Abortions TAB SAB Ect Mult Living   1              Past Medical History  Diagnosis Date  . Anemia    Past Surgical History  Procedure Laterality Date  . No past surgeries     Family History: family history is not on file. Social History:  reports that she has never smoked. She has never used smokeless tobacco. She reports that she does not drink alcohol or use illicit drugs.   Prenatal Transfer Tool  Maternal Diabetes: No Genetic Screening: Normal Maternal Ultrasounds/Referrals: Normal Fetal Ultrasounds or other Referrals:  None Maternal Substance Abuse:  No Significant Maternal Medications:  None Significant Maternal Lab Results:  Lab values include: Group B Strep negative Other Comments:  None  ROS  Dilation: 1.5 Effacement (%): 70 Station: -2 Exam by:: Dr.Hilde Churchman Blood pressure 141/94, pulse 71, temperature 98.7 F (37.1 C), temperature source Oral, resp. rate 18, height  (1.676 m), weight 185 lb (83.915 kg), SpO2 99.00%. Maternal Exam:  Uterine Assessment: Contraction strength is mild.  Contraction frequency is irregular.   Abdomen: Patient reports no abdominal tenderness. Fundal height is c/w dates.   Estimated fetal weight is 7#.   Fetal presentation: vertex  Introitus: Normal vulva. Pelvis: adequate for delivery.   Cervix: Cervix evaluated by digital exam.     Physical Exam  Constitutional: She is oriented to person, place, and time. She appears well-developed  and well-nourished.  GI: Soft. There is no rebound and no guarding.  Neurological: She is alert and oriented to person, place, and time.  Skin: Skin is warm and dry.  Psychiatric: She has a normal mood and affect. Her behavior is normal.    Prenatal labs: ABO, Rh: O/Positive/-- (03/04 0000) Antibody: Negative (03/04 0000) Rubella: Immune (03/04 0000) RPR: NON REAC (09/09 2100)  HBsAg: Negative (03/04 0000)  HIV: Non-reactive (03/04 0000)  GBS: Negative (08/31 0000)   Assessment/Plan: 28yo G1 at [redacted]w[redacted]d with IOL secondary to oligo; GHTN -FB place and pitocin to be started -Will order magnesium sulfate for seizure ppx secondary to oligo, GHTN and brisk reflexes; BPs mild range.  HELLP labs wnl. -Epidural when ready   Tiffannie Sloss 03/17/2014, 8:35 AM

## 2014-03-17 NOTE — Anesthesia Procedure Notes (Signed)
Epidural Patient location during procedure: OB Start time: 03/17/2014 12:45 PM  Staffing Anesthesiologist: Brayton Caves Performed by: anesthesiologist   Preanesthetic Checklist Completed: patient identified, site marked, surgical consent, pre-op evaluation, timeout performed, IV checked, risks and benefits discussed and monitors and equipment checked  Epidural Patient position: sitting Prep: site prepped and draped and DuraPrep Patient monitoring: continuous pulse ox and blood pressure Approach: midline Location: L3-L4 Injection technique: LOR air  Needle:  Needle type: Tuohy  Needle gauge: 17 G Needle length: 9 cm and 9 Needle insertion depth: 5 cm cm Catheter type: closed end flexible Catheter size: 19 Gauge Catheter at skin depth: 10 cm Test dose: negative  Assessment Events: blood not aspirated, injection not painful, no injection resistance, negative IV test and no paresthesia  Additional Notes Patient identified.  Risk benefits discussed including failed block, incomplete pain control, headache, nerve damage, paralysis, blood pressure changes, nausea, vomiting, reactions to medication both toxic or allergic, and postpartum back pain.  Patient expressed understanding and wished to proceed.  All questions were answered.  Sterile technique used throughout procedure and epidural site dressed with sterile barrier dressing. No paresthesia or other complications noted.The patient did not experience any signs of intravascular injection such as tinnitus or metallic taste in mouth nor signs of intrathecal spread such as rapid motor block. Please see nursing notes for vital signs.

## 2014-03-18 LAB — COMPREHENSIVE METABOLIC PANEL
ALT: 7 U/L (ref 0–35)
AST: 24 U/L (ref 0–37)
Albumin: 2 g/dL — ABNORMAL LOW (ref 3.5–5.2)
Alkaline Phosphatase: 137 U/L — ABNORMAL HIGH (ref 39–117)
Anion gap: 13 (ref 5–15)
BUN: 3 mg/dL — ABNORMAL LOW (ref 6–23)
CALCIUM: 7.1 mg/dL — AB (ref 8.4–10.5)
CO2: 21 mEq/L (ref 19–32)
CREATININE: 0.55 mg/dL (ref 0.50–1.10)
Chloride: 104 mEq/L (ref 96–112)
GFR calc Af Amer: >90 mL/min (ref >90)
Glucose, Bld: 86 mg/dL (ref 70–99)
Potassium: 4 mEq/L (ref 3.7–5.3)
Sodium: 138 mEq/L (ref 137–147)
TOTAL PROTEIN: 5.7 g/dL — AB (ref 6.0–8.3)
Total Bilirubin: 0.3 mg/dL (ref 0.3–1.2)

## 2014-03-18 LAB — CBC
HCT: 32.5 % — ABNORMAL LOW (ref 36.0–46.0)
Hemoglobin: 11 g/dL — ABNORMAL LOW (ref 12.0–15.0)
MCH: 29.6 pg (ref 26.0–34.0)
MCHC: 33.8 g/dL (ref 30.0–36.0)
MCV: 87.6 fL (ref 78.0–100.0)
Platelets: 149 10*3/uL — ABNORMAL LOW (ref 150–400)
RBC: 3.71 MIL/uL — ABNORMAL LOW (ref 3.87–5.11)
RDW: 14.7 % (ref 11.5–15.5)
WBC: 11.7 10*3/uL — ABNORMAL HIGH (ref 4.0–10.5)

## 2014-03-18 LAB — LACTATE DEHYDROGENASE: LDH: 280 U/L — ABNORMAL HIGH (ref 94–250)

## 2014-03-18 LAB — MRSA PCR SCREENING: MRSA BY PCR: NEGATIVE

## 2014-03-18 LAB — URIC ACID: URIC ACID, SERUM: 5 mg/dL (ref 2.4–7.0)

## 2014-03-18 MED ORDER — SODIUM CHLORIDE 0.9 % IJ SOLN
3.0000 mL | Freq: Two times a day (BID) | INTRAMUSCULAR | Status: DC
  Administered 2014-03-18: 3 mL via INTRAVENOUS

## 2014-03-18 MED ORDER — SODIUM CHLORIDE 0.9 % IJ SOLN: 3.0000 mL | INTRAMUSCULAR | Status: DC | PRN

## 2014-03-18 MED ORDER — INFLUENZA VAC SPLIT QUAD 0.5 ML IM SUSY
0.5000 mL | PREFILLED_SYRINGE | INTRAMUSCULAR | Status: AC
  Administered 2014-03-19: 0.5 mL via INTRAMUSCULAR
  Filled 2014-03-18: qty 0.5

## 2014-03-18 NOTE — Progress Notes (Signed)
Post Partum Day 1 Subjective: no complaints, up ad lib, voiding, tolerating PO and + flatus No PIH sxs Objective: Blood pressure 129/89, pulse 84, temperature 97.8 F (36.6 C), temperature source Oral, resp. rate 18, height  (1.676 m), weight 80.604 kg (177 lb 11.2 oz), SpO2 100.00%, unknown if currently breastfeeding.  Physical Exam:  General: alert, cooperative, appears stated age and no distress Lochia: appropriate Uterine Fundus: firm Incision: healing well DVT Evaluation: No evidence of DVT seen on physical exam. DTRs 3/5  Recent Labs  03/17/14 1920 03/18/14 0520  HGB 11.5* 11.0*  HCT 33.6* 32.5*   UOP excellent Assessment/Plan: Plan for discharge tomorrow and Breastfeeding GHTN Stable with good diuresis.  DC Mag Circ today   LOS: 2 days   Megham Dwyer C 03/18/2014, 9:14 AM

## 2014-03-18 NOTE — Progress Notes (Signed)
UR chart review completed.  

## 2014-03-18 NOTE — Anesthesia Postprocedure Evaluation (Signed)
Anesthesia Post Note  Patient: Jordan Preston  Procedure(s) Performed: * No procedures listed *  Anesthesia type: Epidural  Patient location: Mother/Baby  Post pain: Pain level controlled  Post assessment: Post-op Vital signs reviewed  Last Vitals:  Filed Vitals:   03/18/14 1400  BP: 130/90  Pulse: 82  Temp:   Resp: 16    Post vital signs: Reviewed  Level of consciousness:alert  Complications: No apparent anesthesia complications

## 2014-03-18 NOTE — Lactation Note (Signed)
This note was copied from the chart of Jordan Toye Rouillard. Lactation Consultation Note    Initial consult with this mom of an early term baby, 68 6/[redacted] weeks gestation, and 35 hours old. I assisted mom with latching baby in both cross cradle and football hold. Everett latched eagerly, with strong suckles and visible swallows. Mom has re, tender left nipple from shallow latches. Mom has wide spaced, small breasts, with little changes during the pregnancy. At this time, mom has good colostrum easy to express, and I showed mom how to hand express. Mitzie Na has had 1 mec stools, and 2 voids. Mom and dad very receptive to teaching. Riley Nearing is their first baby. Baby and Me book lactation pages reviewed with mom, as well as lactation services. Mom knows to call for questions/concerns.  Patient Name: Jordan Preston Date: 03/18/2014 Reason for consult: Initial assessment   Maternal Data Formula Feeding for Exclusion: Yes Reason for exclusion: Admission to Intensive Care Unit (ICU) post-partum (mom in [place]) Has patient been taught Hand Expression?: Yes Does the patient have breastfeeding experience prior to this delivery?: No  Feeding Feeding Type: Breast Fed Length of feed: 30 min  LATCH Score/Interventions Latch: Repeated attempts needed to sustain latch, nipple held in mouth throughout feeding, stimulation needed to elicit sucking reflex. Intervention(s): Adjust position;Assist with latch;Breast massage;Breast compression  Audible Swallowing: A few with stimulation Intervention(s): Skin to skin;Hand expression  Type of Nipple: Everted at rest and after stimulation  Comfort (Breast/Nipple): Filling, red/small blisters or bruises, mild/mod discomfort  Problem noted: Mild/Moderate discomfort Interventions  (Cracked/bleeding/bruising/blister): Expressed breast milk to nipple Interventions (Mild/moderate discomfort): Comfort gels;Hand massage  Hold (Positioning): Assistance needed to correctly  position infant at breast and maintain latch. Intervention(s): Breastfeeding basics reviewed;Support Pillows;Position options;Skin to skin  LATCH Score: 6  Lactation Tools Discussed/Used Tools: Comfort gels   Consult Status Consult Status: Follow-up Date: 03/19/14 Follow-up type: In-patient    Alfred Levins 03/18/2014, 3:17 PM

## 2014-03-19 MED ORDER — IBUPROFEN 600 MG PO TABS: 600.0000 mg | ORAL_TABLET | Freq: Four times a day (QID) | ORAL | Status: DC | PRN

## 2014-03-19 NOTE — Discharge Summary (Signed)
Obstetric Discharge Summary Reason for Admission: induction of labor Prenatal Procedures: none Intrapartum Procedures: spontaneous vaginal delivery Postpartum Procedures: none Complications-Operative and Postpartum: none Hemoglobin  Date Value Ref Range Status  03/18/2014 11.0* 12.0 - 15.0 g/dL Final     HCT  Date Value Ref Range Status  03/18/2014 32.5* 36.0 - 46.0 % Final    Physical Exam:  General: alert, cooperative, appears stated age and no distress Lochia: appropriate Uterine Fundus: firm Incision: healing well DVT Evaluation: No evidence of DVT seen on physical exam.  Discharge Diagnoses: Term Pregnancy-delivered and gestational hypertension  Discharge Information: Date: 03/19/2014 Activity: pelvic rest Diet: routine Medications: Ibuprofen Condition: stable Instructions: refer to practice specific booklet Discharge to: home   Newborn Data: Live born female  Birth Weight: 6 lb 5.6 oz (2880 g) APGAR: 8, 9  Home with mother.  Jordan Preston C 03/19/2014, 8:26 AM

## 2014-03-22 ENCOUNTER — Ambulatory Visit (HOSPITAL_COMMUNITY)
Admission: RE | Admit: 2014-03-22 | Discharge: 2014-03-22 | Disposition: A | Discharge status: Home / Self Care | Payer: BC Managed Care – PPO | Source: Ambulatory Visit | Attending: Obstetrics & Gynecology | Admitting: Obstetrics & Gynecology

## 2014-03-22 NOTE — Lactation Note (Signed)
Lactation Consult  Mother's reason for visit:  Poor latching, sore nipples Visit Type: Feeding assessment Appointment Notes:  None Consult:  Initial Lactation Consultant:  Esgar Barnick S  ________________________________________________________________________  Baby's Name: Everett Withey  Date of Birth: 03/17/2014  Pediatrician: [place]  Gender: female  Gestational Age: [redacted]w[redacted]d (At Birth)  Birth Weight: 6 lb 5.6 oz (2880 g)  Weight at Discharge: Weight: 6 lb 0.5 oz (2735 g) Date of Discharge: 03/19/2014  Filed Weights   03/17/14 1811 03/19/14 0030  Weight: 6 lb 5.6 oz (2880 g) 6 lb 0.5 oz (2735 g)  Last weight taken from location outside of [facility]:  Weight today: 6-1.4   ________________________________________________________________________  Mother's Name: Keyira Delpilar Type of delivery:  Vaginal Breastfeeding Experience:  First baby Maternal Medical Conditions:  NONE   ________________________________________________________________________  Breastfeeding History (Post Discharge)  Frequency of breastfeeding:  Every 2-2 1/2 hours Duration of feeding:  15-25 minutes    Pumping  Type of pump:  Manual Frequency: before feeding when too full Volume:  15-30ml  Infant Intake and Output Assessment  Voids:  4 in 24 hrs.  Color:  Clear yellow Stools:  1-2 in 24 hrs.  Color:  Green  ________________________________________________________________________  Maternal Breast Assessment  Breast:  Full and Compressible Nipple:  Reddened and Cracked Pain level:  Severe per mom Pain interventions:  Comfort gels and coconut oil  _______________________________________________________________________ Feeding Assessment/Evaluation  Mom and 5 day old infant here for feeding assessment.  Mom c/o worsening cracked and painful nipples since discharge.  Breasts are very full and nipples red and cracked.  Observed mom latch baby using cross cradle hold.  Mom allowing baby to  latch himself and he is only grasping nipple.  Parents shown how to firmly compress breast tissue and bring baby to breast quickly when he opens wide.  Baby latches easily and well using corrected techniques.  Bottom lip did need to be untucked.  Mom states latch feels much better.  FOB very supportive and helpful.  Baby nursed for 25 minutes and softened breast well.  Nipple round when baby came off.  Mom was able to latch baby to opposite breast with deep latch and improved comfort.  Baby nursed well for another 20 minutes and transferred a total of 62 mls. Plan is for parents to continue practicing correct techniques learned today and call office if any questions or concerns.  Encouraged breastfeeding support group.  Initial feeding assessment:  Infant's oral assessment:  WNL  Positioning:  Cross cradle Right breast/left breast  LATCH documentation:  Latch:  2 = Grasps breast easily, tongue down, lips flanged, rhythmical sucking.  Audible swallowing:  2 = Spontaneous and intermittent  Type of nipple:  2 = Everted at rest and after stimulation  Comfort (Breast/Nipple):  0 = Engorged, cracked, bleeding, large blisters, severe discomfort  Hold (Positioning):  1 = Assistance needed to correctly position infant at breast and maintain latch  LATCH score:  7  Attached assessment:  Deep  Lips flanged:  No.  Lips untucked:  Yes.    Suck assessment:  Nutritive  Tools:  Pump and Comfort gels Instructed on use and cleaning of tool:  Yes.    Pre-feed weight:  2760 g  (6 lb. 1.4oz.) Post-feed weight:  2822 g (6 lb. 3.6 oz.) Amount transferred:  [id number] Amount supplemented:  0 ml      Total amount transferred:  62 ml Total supplement given:  0 ml

## 2014-04-21 ENCOUNTER — Other Ambulatory Visit (HOSPITAL_COMMUNITY): Payer: Self-pay | Admitting: Urology

## 2014-04-21 DIAGNOSIS — M368 Systemic disorders of connective tissue in other diseases classified elsewhere: Secondary | ICD-10-CM

## 2014-05-05 ENCOUNTER — Ambulatory Visit (HOSPITAL_COMMUNITY): Payer: BC Managed Care – PPO

## 2014-05-09 ENCOUNTER — Encounter (HOSPITAL_COMMUNITY): Payer: Self-pay | Admitting: *Deleted

## 2015-03-29 ENCOUNTER — Ambulatory Visit (INDEPENDENT_AMBULATORY_CARE_PROVIDER_SITE_OTHER): Payer: Managed Care, Other (non HMO) | Admitting: Obstetrics and Gynecology

## 2015-03-29 ENCOUNTER — Encounter: Payer: Self-pay | Admitting: Obstetrics and Gynecology

## 2015-03-29 VITALS — BP 130/84 | HR 91 | Wt 135.0 lb

## 2015-03-29 DIAGNOSIS — Z3481 Encounter for supervision of other normal pregnancy, first trimester: Secondary | ICD-10-CM

## 2015-03-29 DIAGNOSIS — O09891 Supervision of other high risk pregnancies, first trimester: Secondary | ICD-10-CM

## 2015-03-29 DIAGNOSIS — Z23 Encounter for immunization: Secondary | ICD-10-CM

## 2015-03-29 DIAGNOSIS — Z3480 Encounter for supervision of other normal pregnancy, unspecified trimester: Secondary | ICD-10-CM | POA: Insufficient documentation

## 2015-03-29 DIAGNOSIS — O09291 Supervision of pregnancy with other poor reproductive or obstetric history, first trimester: Secondary | ICD-10-CM

## 2015-03-29 DIAGNOSIS — Z8759 Personal history of other complications of pregnancy, childbirth and the puerperium: Secondary | ICD-10-CM

## 2015-03-29 NOTE — Patient Instructions (Addendum)
First Trimester of Pregnancy The first trimester of pregnancy is from week 1 until the end of week 12 (months 1 through 3). A week after a sperm fertilizes an egg, the egg will implant on the wall of the uterus. This embryo will begin to develop into a baby. Genes from you and your partner are forming the baby. The female genes determine whether the baby is a boy or a girl. At 6-8 weeks, the eyes and face are formed, and the heartbeat can be seen on ultrasound. At the end of 12 weeks, all the baby's organs are formed.  Now that you are pregnant, you will want to do everything you can to have a healthy baby. Two of the most important things are to get good prenatal care and to follow your health care provider's instructions. Prenatal care is all the medical care you receive before the baby's birth. This care will help prevent, find, and treat any problems during the pregnancy and childbirth. BODY CHANGES Your body goes through many changes during pregnancy. The changes vary from woman to woman.   You may gain or lose a couple of pounds at first.  You may feel sick to your stomach (nauseous) and throw up (vomit). If the vomiting is uncontrollable, call your health care provider.  You may tire easily.  You may develop headaches that can be relieved by medicines approved by your health care provider.  You may urinate more often. Painful urination may mean you have a bladder infection.  You may develop heartburn as a result of your pregnancy.  You may develop constipation because certain hormones are causing the muscles that push waste through your intestines to slow down.  You may develop hemorrhoids or swollen, bulging veins (varicose veins).  Your breasts may begin to grow larger and become tender. Your nipples may stick out more, and the tissue that surrounds them (areola) may become darker.  Your gums may bleed and may be sensitive to brushing and flossing.  Dark spots or blotches (chloasma,  mask of pregnancy) may develop on your face. This will likely fade after the baby is born.  Your menstrual periods will stop.  You may have a loss of appetite.  You may develop cravings for certain kinds of food.  You may have changes in your emotions from day to day, such as being excited to be pregnant or being concerned that something may go wrong with the pregnancy and baby.  You may have more vivid and strange dreams.  You may have changes in your hair. These can include thickening of your hair, rapid growth, and changes in texture. Some women also have hair loss during or after pregnancy, or hair that feels dry or thin. Your hair will most likely return to normal after your baby is born. WHAT TO EXPECT AT YOUR PRENATAL VISITS During a routine prenatal visit:  You will be weighed to make sure you and the baby are growing normally.  Your blood pressure will be taken.  Your abdomen will be measured to track your baby's growth.  The fetal heartbeat will be listened to starting around week 10 or 12 of your pregnancy.  Test results from any previous visits will be discussed. Your health care provider may ask you:  How you are feeling.  If you are feeling the baby move.  If you have had any abnormal symptoms, such as leaking fluid, bleeding, severe headaches, or abdominal cramping.  If you have any questions. Other tests   that may be performed during your first trimester include:  Blood tests to find your blood type and to check for the presence of any previous infections. They will also be used to check for low iron levels (anemia) and Rh antibodies. Later in the pregnancy, blood tests for diabetes will be done along with other tests if problems develop.  Urine tests to check for infections, diabetes, or protein in the urine.  An ultrasound to confirm the proper growth and development of the baby.  An amniocentesis to check for possible genetic problems.  Fetal screens for  spina bifida and Down syndrome.  You may need other tests to make sure you and the baby are doing well. HOME CARE INSTRUCTIONS  Medicines  Follow your health care provider's instructions regarding medicine use. Specific medicines may be either safe or unsafe to take during pregnancy.  Take your prenatal vitamins as directed.  If you develop constipation, try taking a stool softener if your health care provider approves. Diet  Eat regular, well-balanced meals. Choose a variety of foods, such as meat or vegetable-based protein, fish, milk and low-fat dairy products, vegetables, fruits, and whole grain breads and cereals. Your health care provider will help you determine the amount of weight gain that is right for you.  Avoid raw meat and uncooked cheese. These carry germs that can cause birth defects in the baby.  Eating four or five small meals rather than three large meals a day may help relieve nausea and vomiting. If you start to feel nauseous, eating a few soda crackers can be helpful. Drinking liquids between meals instead of during meals also seems to help nausea and vomiting.  If you develop constipation, eat more high-fiber foods, such as fresh vegetables or fruit and whole grains. Drink enough fluids to keep your urine clear or pale yellow. Activity and Exercise  Exercise only as directed by your health care provider. Exercising will help you:  Control your weight.  Stay in shape.  Be prepared for labor and delivery.  Experiencing pain or cramping in the lower abdomen or low back is a good sign that you should stop exercising. Check with your health care provider before continuing normal exercises.  Try to avoid standing for long periods of time. Move your legs often if you must stand in one place for a long time.  Avoid heavy lifting.  Wear low-heeled shoes, and practice good posture.  You may continue to have sex unless your health care provider directs you  otherwise. Relief of Pain or Discomfort  Wear a good support bra for breast tenderness.   Take warm sitz baths to soothe any pain or discomfort caused by hemorrhoids. Use hemorrhoid cream if your health care provider approves.   Rest with your legs elevated if you have leg cramps or low back pain.  If you develop varicose veins in your legs, wear support hose. Elevate your feet for 15 minutes, 3-4 times a day. Limit salt in your diet. Prenatal Care  Schedule your prenatal visits by the twelfth week of pregnancy. They are usually scheduled monthly at first, then more often in the last 2 months before delivery.  Write down your questions. Take them to your prenatal visits.  Keep all your prenatal visits as directed by your health care provider. Safety  Wear your seat belt at all times when driving.  Make a list of emergency phone numbers, including numbers for family, friends, the hospital, and police and fire departments. General Tips    Ask your health care provider for a referral to a local prenatal education class. Begin classes no later than at the beginning of month 6 of your pregnancy.  Ask for help if you have counseling or nutritional needs during pregnancy. Your health care provider can offer advice or refer you to specialists for help with various needs.  Do not use hot tubs, steam rooms, or saunas.  Do not douche or use tampons or scented sanitary pads.  Do not cross your legs for long periods of time.  Avoid cat litter boxes and soil used by cats. These carry germs that can cause birth defects in the baby and possibly loss of the fetus by miscarriage or stillbirth.  Avoid all smoking, herbs, alcohol, and medicines not prescribed by your health care provider. Chemicals in these affect the formation and growth of the baby.  Schedule a dentist appointment. At home, brush your teeth with a soft toothbrush and be gentle when you floss. SEEK MEDICAL CARE IF:   You have  dizziness.  You have mild pelvic cramps, pelvic pressure, or nagging pain in the abdominal area.  You have persistent nausea, vomiting, or diarrhea.  You have a bad smelling vaginal discharge.  You have pain with urination.  You notice increased swelling in your face, hands, legs, or ankles. SEEK IMMEDIATE MEDICAL CARE IF:   You have a fever.  You are leaking fluid from your vagina.  You have spotting or bleeding from your vagina.  You have severe abdominal cramping or pain.  You have rapid weight gain or loss.  You vomit blood or material that looks like coffee grounds.  You are exposed to German measles and have never had them.  You are exposed to fifth disease or chickenpox.  You develop a severe headache.  You have shortness of breath.  You have any kind of trauma, such as from a fall or a car accident. Document Released: 06/18/2001 Document Revised: 11/08/2013 Document Reviewed: 05/04/2013 ExitCare Patient Information 2015 [employer]. This information is not intended to replace advice given to you by your health care provider. Make sure you discuss any questions you have with your health care provider.  Morning Sickness Morning sickness is when you feel sick to your stomach (nauseous) during pregnancy. This nauseous feeling may or may not come with vomiting. It often occurs in the morning but can be a problem any time of day. Morning sickness is most common during the first trimester, but it may continue throughout pregnancy. While morning sickness is unpleasant, it is usually harmless unless you develop severe and continual vomiting (hyperemesis gravidarum). This condition requires more intense treatment.  CAUSES  The cause of morning sickness is not completely known but seems to be related to normal hormonal changes that occur in pregnancy. RISK FACTORS You are at greater risk if you:  Experienced nausea or vomiting before your pregnancy.  Had morning  sickness during a previous pregnancy.  Are pregnant with more than one baby, such as twins. TREATMENT  Do not use any medicines (prescription, over-the-counter, or herbal) for morning sickness without first talking to your health care provider. Your health care provider may prescribe or recommend:  Vitamin B6 supplements.  Anti-nausea medicines.  The herbal medicine ginger. HOME CARE INSTRUCTIONS   Only take over-the-counter or prescription medicines as directed by your health care provider.  Taking multivitamins before getting pregnant can prevent or decrease the severity of morning sickness in most women.  Eat a piece of dry   toast or unsalted crackers before getting out of bed in the morning.  Eat five or six small meals a day.  Eat dry and bland foods (rice, baked potato). Foods high in carbohydrates are often helpful.  Do not drink liquids with your meals. Drink liquids between meals.  Avoid greasy, fatty, and spicy foods.  Get someone to cook for you if the smell of any food causes nausea and vomiting.  If you feel nauseous after taking prenatal vitamins, take the vitamins at night or with a snack.  Snack on protein foods (nuts, yogurt, cheese) between meals if you are hungry.  Eat unsweetened gelatins for desserts.  Wearing an acupressure wristband (worn for sea sickness) may be helpful.  Acupuncture may be helpful.  Do not smoke.  Get a humidifier to keep the air in your house free of odors.  Get plenty of fresh air. SEEK MEDICAL CARE IF:   Your home remedies are not working, and you need medicine.  You feel dizzy or lightheaded.  You are losing weight. SEEK IMMEDIATE MEDICAL CARE IF:   You have persistent and uncontrolled nausea and vomiting.  You pass out (faint). MAKE SURE YOU:  Understand these instructions.  Will watch your condition.  Will get help right away if you are not doing well or get worse. Document Released: 08/15/2006 Document  Revised: 06/29/2013 Document Reviewed: 12/09/2012 ExitCare Patient Information 2015 [employer]. This information is not intended to replace advice given to you by your health care provider. Make sure you discuss any questions you have with your health care provider.  

## 2015-03-29 NOTE — Progress Notes (Signed)
Subjective:  Jordan Preston is a 29 y.o. G2P1001 at [redacted]w[redacted]d being seen today for ongoing prenatal care.  This is her first visit here, but she has had 2 visits at private OB office including pelvic, [place] (states EDD changed to 11/07/15) and PN labs. ROI for records sent (pending). Changed provider due to convenience of location, dissatisfactionwith care at other office due to long wait time and she felt that providers treated her impersonally. Patient reports nausea that resolves after a few hours and declines Rx meds now.   Contractions: Not present.  Vag. Bleeding: None. Movement: Absent. Denies leaking of fluid.   The following portions of the patient's history were reviewed and updated as appropriate: allergies, current medications, past family history, past medical history, past social history, past surgical history and problem list. States had BP elevations and no proteinuria; hadFB IOL at 37.6 wks.  Objective:   Filed Vitals:   03/29/15 1352  BP: 130/84  Pulse: 91  Weight: 135 lb (61.236 kg)    Fetal Status: Fetal Heart Rate (bpm): + [place]   Movement: Absent     General:  Alert, oriented and cooperative. Patient is in no acute distress.  Skin: Skin is warm and dry. No rash noted.   Cardiovascular: Normal heart rate noted  Respiratory: Normal respiratory effort, no problems with respiration noted  Abdomen: Soft, gravid, appropriate for gestational age. Pain/Pressure: Absent     Pelvic: Vag. Bleeding: None Vag D/C Character: Thin   Cervical exam deferred        Extremities: Normal range of motion.  Edema: None  Mental Status: Normal mood and affect. Normal behavior. Normal judgment and thought content.   Urinalysis:      Assessment and Plan:  Pregnancy: G2P1001 at [redacted]w[redacted]d Hx of preeclampsia, prior pregnancy, currently pregnant, first trimester - Plan: Flu Vaccine QUAD 36+ mos IM (Fluarix, Quad PF), [place] MFM Fetal Nuchal Translucency, CANCELED: [place] MFM OB Transvaginal  Encounter for supervision of  other normal pregnancy in first trimester  Short interval between pregnancies affecting pregnancy in first trimester, antepartum  History of gestational hypertension   - Flu Vaccine QUAD 36+ mos IM (Fluarix, Quad PF) - [place] MFM OB Transvaginal; Future   PN records pending.  First trimester general obstetric precautions including but not limited to vaginal bleeding, cramping, leaking of fluid and fetal movement were reviewed in detail with the patient. Will call office if N/V symptoms worsen.  Please refer to After Visit Summary for other counseling recommendations.  Return in about 1 month (around 04/28/2015).  MFM for genetic screening.    Danae Orleans, CNM

## 2015-04-20 ENCOUNTER — Encounter: Payer: Self-pay | Admitting: *Deleted

## 2015-04-20 ENCOUNTER — Telehealth: Payer: Self-pay | Admitting: *Deleted

## 2015-04-20 NOTE — Telephone Encounter (Signed)
Updated the patient EDD based on first US.

## 2015-04-27 ENCOUNTER — Ambulatory Visit (HOSPITAL_COMMUNITY): Payer: Managed Care, Other (non HMO) | Attending: Obstetrics and Gynecology

## 2015-04-27 ENCOUNTER — Other Ambulatory Visit (HOSPITAL_COMMUNITY): Payer: Managed Care, Other (non HMO)

## 2015-04-28 ENCOUNTER — Ambulatory Visit (INDEPENDENT_AMBULATORY_CARE_PROVIDER_SITE_OTHER): Payer: Managed Care, Other (non HMO) | Admitting: Certified Nurse Midwife

## 2015-04-28 VITALS — BP 124/84 | HR 92 | Wt 137.0 lb

## 2015-04-28 DIAGNOSIS — Z3401 Encounter for supervision of normal first pregnancy, first trimester: Secondary | ICD-10-CM

## 2015-04-28 NOTE — Patient Instructions (Signed)

## 2015-04-28 NOTE — Progress Notes (Signed)
  Subjective:  Jordan Preston is a 29 y.o. G2P1001 at 7052w3d being seen today for ongoing prenatal care.  Patient reports no complaints  Contractions: Not present.  Vag. Bleeding: None. Movement: Absent. Denies leaking of fluid.   The following portions of the patient's history were reviewed and updated as appropriate: allergies, current medications, past family history, past medical history, past social history, past surgical history and problem list. Problem list updated.  Objective:   Filed Vitals:   04/28/15 0932  BP: 124/84  Pulse: 92  Weight: 137 lb (62.143 kg)    Fetal Status: Fetal Heart Rate (bpm): 168   Movement: Absent     General:  Alert, oriented and cooperative. Patient is in no acute distress.  Skin: Skin is warm and dry. No rash noted.   Cardiovascular: Normal heart rate noted  Respiratory: Normal respiratory effort, no problems with respiration noted  Abdomen: Soft, gravid, appropriate for gestational age. Pain/Pressure: Absent     Pelvic: Vag. Bleeding: None Vag D/C Character: Thin   Cervical exam deferred        Extremities: Normal range of motion.  Edema: None  Mental Status: Normal mood and affect. Normal behavior. Normal judgment and thought content.   Urinalysis: Urine Protein: Trace Urine Glucose: Negative  Assessment and Plan:  Pregnancy: G2P1001 at 4852w3d Prenatal labs drawn  There are no diagnoses linked to this encounter. Preterm labor symptoms and general obstetric precautions including but not limited to vaginal bleeding, contractions, leaking of fluid and fetal movement were reviewed in detail with the patient. Please refer to After Visit Summary for other counseling recommendations.  No Follow-up on file.   Rhea PinkLori A Donivin Wirt, CNM

## 2015-04-28 NOTE — Progress Notes (Signed)
[facility], no prenatal labs are noted, pt seems to think that they did draw prenatal labs.  Left a message for medical records to retrieve those labs.  Informed pt that once we verified and if they had not been completed that she would need to come by early next week to have a prenatal panel completed. Pt acknowledged.

## 2015-05-03 ENCOUNTER — Encounter: Payer: Self-pay | Admitting: *Deleted

## 2015-05-26 ENCOUNTER — Ambulatory Visit (INDEPENDENT_AMBULATORY_CARE_PROVIDER_SITE_OTHER): Payer: Managed Care, Other (non HMO) | Admitting: Family Medicine

## 2015-05-26 VITALS — BP 125/82 | HR 101 | Wt 141.0 lb

## 2015-05-26 DIAGNOSIS — Z36 Encounter for antenatal screening of mother: Secondary | ICD-10-CM

## 2015-05-26 DIAGNOSIS — Z113 Encounter for screening for infections with a predominantly sexual mode of transmission: Secondary | ICD-10-CM

## 2015-05-26 DIAGNOSIS — Z3482 Encounter for supervision of other normal pregnancy, second trimester: Secondary | ICD-10-CM

## 2015-05-26 NOTE — Progress Notes (Signed)
Subjective:  Jordan Preston is a 29 y.o. G2P1001 at 2927w3d being seen today for ongoing prenatal care.  Patient reports no complaints.  Contractions: Not present.  Vag. Bleeding: None. Movement: Present. Denies leaking of fluid.   The following portions of the patient's history were reviewed and updated as appropriate: allergies, current medications, past family history, past medical history, past social history, past surgical history and problem list. Problem list updated.  Objective:   Filed Vitals:   05/26/15 0909  BP: 125/82  Pulse: 101  Weight: 141 lb (63.957 kg)    Fetal Status: Fetal Heart Rate (bpm): 158   Movement: Present     General:  Alert, oriented and cooperative. Patient is in no acute distress.  Skin: Skin is warm and dry. No rash noted.   Cardiovascular: Normal heart rate noted  Respiratory: Normal respiratory effort, no problems with respiration noted  Abdomen: Soft, gravid, appropriate for gestational age. Pain/Pressure: Absent     Pelvic: Vag. Bleeding: None Vag D/C Character: Thin   Cervical exam deferred        Extremities: Normal range of motion.  Edema: None  Mental Status: Normal mood and affect. Normal behavior. Normal judgment and thought content.   Urinalysis:    prot trace, gluc neg  Assessment and Plan:  Pregnancy: G2P1001 at 5827w3d  1. Encounter for supervision of other normal pregnancy in second trimester Continue routine prenatal care. Has not yet gotten new OB labs-->will obtaine today Schedule anatomy - Urine cytology ancillary only - Prenatal Profile - US MFM OB COMP + 14 WK; Future  Preterm labor symptoms and general obstetric precautions including but not limited to vaginal bleeding, contractions, leaking of fluid and fetal movement were reviewed in detail with the patient. Please refer to After Visit Summary for other counseling recommendations.  Return in 4 weeks (on 06/23/2015).   Reva Boresanya S Catlin Aycock, MD

## 2015-05-26 NOTE — Patient Instructions (Signed)
Second Trimester of Pregnancy The second trimester is from week 13 through week 28, months 4 through 6. The second trimester is often a time when you feel your best. Your body has also adjusted to being pregnant, and you begin to feel better physically. Usually, morning sickness has lessened or quit completely, you may have more energy, and you may have an increase in appetite. The second trimester is also a time when the fetus is growing rapidly. At the end of the sixth month, the fetus is about 9 inches long and weighs about 1 pounds. You will likely begin to feel the baby move (quickening) between 18 and 20 weeks of the pregnancy. BODY CHANGES Your body goes through many changes during pregnancy. The changes vary from woman to woman.   Your weight will continue to increase. You will notice your lower abdomen bulging out.  You may begin to get stretch marks on your hips, abdomen, and breasts.  You may develop headaches that can be relieved by medicines approved by your health care provider.  You may urinate more often because the fetus is pressing on your bladder.  You may develop or continue to have heartburn as a result of your pregnancy.  You may develop constipation because certain hormones are causing the muscles that push waste through your intestines to slow down.  You may develop hemorrhoids or swollen, bulging veins (varicose veins).  You may have back pain because of the weight gain and pregnancy hormones relaxing your joints between the bones in your pelvis and as a result of a shift in weight and the muscles that support your balance.  Your breasts will continue to grow and be tender.  Your gums may bleed and may be sensitive to brushing and flossing.  Dark spots or blotches (chloasma, mask of pregnancy) may develop on your face. This will likely fade after the baby is born.  A dark line from your belly button to the pubic area (linea nigra) may appear. This will likely  fade after the baby is born.  You may have changes in your hair. These can include thickening of your hair, rapid growth, and changes in texture. Some women also have hair loss during or after pregnancy, or hair that feels dry or thin. Your hair will most likely return to normal after your baby is born. WHAT TO EXPECT AT YOUR PRENATAL VISITS During a routine prenatal visit:  You will be weighed to make sure you and the fetus are growing normally.  Your blood pressure will be taken.  Your abdomen will be measured to track your baby's growth.  The fetal heartbeat will be listened to.  Any test results from the previous visit will be discussed. Your health care provider may ask you:  How you are feeling.  If you are feeling the baby move.  If you have had any abnormal symptoms, such as leaking fluid, bleeding, severe headaches, or abdominal cramping.  If you are using any tobacco products, including cigarettes, chewing tobacco, and electronic cigarettes.  If you have any questions. Other tests that may be performed during your second trimester include:  Blood tests that check for:  Low iron levels (anemia).  Gestational diabetes (between 24 and 28 weeks).  Rh antibodies.  Urine tests to check for infections, diabetes, or protein in the urine.  An ultrasound to confirm the proper growth and development of the baby.  An amniocentesis to check for possible genetic problems.  Fetal screens for spina bifida   and Down syndrome.  HIV (human immunodeficiency virus) testing. Routine prenatal testing includes screening for HIV, unless you choose not to have this test. HOME CARE INSTRUCTIONS   Avoid all smoking, herbs, alcohol, and unprescribed drugs. These chemicals affect the formation and growth of the baby.  Do not use any tobacco products, including cigarettes, chewing tobacco, and electronic cigarettes. If you need help quitting, ask your health care provider. You may receive  counseling support and other resources to help you quit.  Follow your health care provider's instructions regarding medicine use. There are medicines that are either safe or unsafe to take during pregnancy.  Exercise only as directed by your health care provider. Experiencing uterine cramps is a good sign to stop exercising.  Continue to eat regular, healthy meals.  Wear a good support bra for breast tenderness.  Do not use hot tubs, steam rooms, or saunas.  Wear your seat belt at all times when driving.  Avoid raw meat, uncooked cheese, cat litter boxes, and soil used by cats. These carry germs that can cause birth defects in the baby.  Take your prenatal vitamins.  Take 1500-2000 mg of calcium daily starting at the 20th week of pregnancy until you deliver your baby.  Try taking a stool softener (if your health care provider approves) if you develop constipation. Eat more high-fiber foods, such as fresh vegetables or fruit and whole grains. Drink plenty of fluids to keep your urine clear or pale yellow.  Take warm sitz baths to soothe any pain or discomfort caused by hemorrhoids. Use hemorrhoid cream if your health care provider approves.  If you develop varicose veins, wear support hose. Elevate your feet for 15 minutes, 3-4 times a day. Limit salt in your diet.  Avoid heavy lifting, wear low heel shoes, and practice good posture.  Rest with your legs elevated if you have leg cramps or low back pain.  Visit your dentist if you have not gone yet during your pregnancy. Use a soft toothbrush to brush your teeth and be gentle when you floss.  A sexual relationship may be continued unless your health care provider directs you otherwise.  Continue to go to all your prenatal visits as directed by your health care provider. SEEK MEDICAL CARE IF:   You have dizziness.  You have mild pelvic cramps, pelvic pressure, or nagging pain in the abdominal area.  You have persistent nausea,  vomiting, or diarrhea.  You have a bad smelling vaginal discharge.  You have pain with urination. SEEK IMMEDIATE MEDICAL CARE IF:   You have a fever.  You are leaking fluid from your vagina.  You have spotting or bleeding from your vagina.  You have severe abdominal cramping or pain.  You have rapid weight gain or loss.  You have shortness of breath with chest pain.  You notice sudden or extreme swelling of your face, hands, ankles, feet, or legs.  You have not felt your baby move in over an hour.  You have severe headaches that do not go away with medicine.  You have vision changes.   This information is not intended to replace advice given to you by your health care provider. Make sure you discuss any questions you have with your health care provider.   Document Released: 06/18/2001 Document Revised: 07/15/2014 Document Reviewed: 08/25/2012 Elsevier Interactive Patient Education 2016 [employer]   Breastfeeding Deciding to breastfeed is one of the best choices you can make for you and your baby. A change   in hormones during pregnancy causes your breast tissue to grow and increases the number and size of your milk ducts. These hormones also allow proteins, sugars, and fats from your blood supply to make breast milk in your milk-producing glands. Hormones prevent breast milk from being released before your baby is born as well as prompt milk flow after birth. Once breastfeeding has begun, thoughts of your baby, as well as his or her sucking or crying, can stimulate the release of milk from your milk-producing glands.  BENEFITS OF BREASTFEEDING For Your Baby  Your first milk (colostrum) helps your baby's digestive system function better.  There are antibodies in your milk that help your baby fight off infections.  Your baby has a lower incidence of asthma, allergies, and sudden infant death syndrome.  The nutrients in breast milk are better for your baby than infant  formulas and are designed uniquely for your baby's needs.  Breast milk improves your baby's brain development.  Your baby is less likely to develop other conditions, such as childhood obesity, asthma, or type 2 diabetes mellitus. For You  Breastfeeding helps to create a very special bond between you and your baby.  Breastfeeding is convenient. Breast milk is always available at the correct temperature and costs nothing.  Breastfeeding helps to burn calories and helps you lose the weight gained during pregnancy.  Breastfeeding makes your uterus contract to its prepregnancy size faster and slows bleeding (lochia) after you give birth.   Breastfeeding helps to lower your risk of developing type 2 diabetes mellitus, osteoporosis, and breast or ovarian cancer later in life. SIGNS THAT YOUR BABY IS HUNGRY Early Signs of Hunger  Increased alertness or activity.  Stretching.  Movement of the head from side to side.  Movement of the head and opening of the mouth when the corner of the mouth or cheek is stroked (rooting).  Increased sucking sounds, smacking lips, cooing, sighing, or squeaking.  Hand-to-mouth movements.  Increased sucking of fingers or hands. Late Signs of Hunger  Fussing.  Intermittent crying. Extreme Signs of Hunger Signs of extreme hunger will require calming and consoling before your baby will be able to breastfeed successfully. Do not wait for the following signs of extreme hunger to occur before you initiate breastfeeding:  Restlessness.  A loud, strong cry.  Screaming. BREASTFEEDING BASICS Breastfeeding Initiation  Find a comfortable place to sit or lie down, with your neck and back well supported.  Place a pillow or rolled up blanket under your baby to bring him or her to the level of your breast (if you are seated). Nursing pillows are specially designed to help support your arms and your baby while you breastfeed.  Make sure that your baby's  abdomen is facing your abdomen.  Gently massage your breast. With your fingertips, massage from your chest wall toward your nipple in a circular motion. This encourages milk flow. You may need to continue this action during the feeding if your milk flows slowly.  Support your breast with 4 fingers underneath and your thumb above your nipple. Make sure your fingers are well away from your nipple and your baby's mouth.  Stroke your baby's lips gently with your finger or nipple.  When your baby's mouth is open wide enough, quickly bring your baby to your breast, placing your entire nipple and as much of the colored area around your nipple (areola) as possible into your baby's mouth.  More areola should be visible above your baby's upper lip than   below the lower lip.  Your baby's tongue should be between his or her lower gum and your breast.  Ensure that your baby's mouth is correctly positioned around your nipple (latched). Your baby's lips should create a seal on your breast and be turned out (everted).  It is common for your baby to suck about 2-3 minutes in order to start the flow of breast milk. Latching Teaching your baby how to latch on to your breast properly is very important. An improper latch can cause nipple pain and decreased milk supply for you and poor weight gain in your baby. Also, if your baby is not latched onto your nipple properly, he or she may swallow some air during feeding. This can make your baby fussy. Burping your baby when you switch breasts during the feeding can help to get rid of the air. However, teaching your baby to latch on properly is still the best way to prevent fussiness from swallowing air while breastfeeding. Signs that your baby has successfully latched on to your nipple:  Silent tugging or silent sucking, without causing you pain.  Swallowing heard between every 3-4 sucks.  Muscle movement above and in front of his or her ears while sucking. Signs  that your baby has not successfully latched on to nipple:  Sucking sounds or smacking sounds from your baby while breastfeeding.  Nipple pain. If you think your baby has not latched on correctly, slip your finger into the corner of your baby's mouth to break the suction and place it between your baby's gums. Attempt breastfeeding initiation again. Signs of Successful Breastfeeding Signs from your baby:  A gradual decrease in the number of sucks or complete cessation of sucking.  Falling asleep.  Relaxation of his or her body.  Retention of a small amount of milk in his or her mouth.  Letting go of your breast by himself or herself. Signs from you:  Breasts that have increased in firmness, weight, and size 1-3 hours after feeding.  Breasts that are softer immediately after breastfeeding.  Increased milk volume, as well as a change in milk consistency and color by the fifth day of breastfeeding.  Nipples that are not sore, cracked, or bleeding. Signs That Your Baby is Getting Enough Milk  Wetting at least 3 diapers in a 24-hour period. The urine should be clear and pale yellow by age 5 days.  At least 3 stools in a 24-hour period by age 5 days. The stool should be soft and yellow.  At least 3 stools in a 24-hour period by age 7 days. The stool should be seedy and yellow.  No loss of weight greater than 10% of birth weight during the first 3 days of age.  Average weight gain of 4-7 ounces (113-198 g) per week after age 4 days.  Consistent daily weight gain by age 5 days, without weight loss after the age of 2 weeks. After a feeding, your baby may spit up a small amount. This is common. BREASTFEEDING FREQUENCY AND DURATION Frequent feeding will help you make more milk and can prevent sore nipples and breast engorgement. Breastfeed when you feel the need to reduce the fullness of your breasts or when your baby shows signs of hunger. This is called "breastfeeding on demand." Avoid  introducing a pacifier to your baby while you are working to establish breastfeeding (the first 4-6 weeks after your baby is born). After this time you may choose to use a pacifier. Research has shown that   pacifier use during the first year of a baby's life decreases the risk of sudden infant death syndrome (SIDS). Allow your baby to feed on each breast as long as he or she wants. Breastfeed until your baby is finished feeding. When your baby unlatches or falls asleep while feeding from the first breast, offer the second breast. Because newborns are often sleepy in the first few weeks of life, you may need to awaken your baby to get him or her to feed. Breastfeeding times will vary from baby to baby. However, the following rules can serve as a guide to help you ensure that your baby is properly fed:  Newborns (babies 4 weeks of age or younger) may breastfeed every 1-3 hours.  Newborns should not go longer than 3 hours during the day or 5 hours during the night without breastfeeding.  You should breastfeed your baby a minimum of 8 times in a 24-hour period until you begin to introduce solid foods to your baby at around 6 months of age. BREAST MILK PUMPING Pumping and storing breast milk allows you to ensure that your baby is exclusively fed your breast milk, even at times when you are unable to breastfeed. This is especially important if you are going back to work while you are still breastfeeding or when you are not able to be present during feedings. Your lactation consultant can give you guidelines on how long it is safe to store breast milk. A breast pump is a machine that allows you to pump milk from your breast into a sterile bottle. The pumped breast milk can then be stored in a refrigerator or freezer. Some breast pumps are operated by hand, while others use electricity. Ask your lactation consultant which type will work best for you. Breast pumps can be purchased, but some hospitals and  breastfeeding support groups lease breast pumps on a monthly basis. A lactation consultant can teach you how to hand express breast milk, if you prefer not to use a pump. CARING FOR YOUR BREASTS WHILE YOU BREASTFEED Nipples can become dry, cracked, and sore while breastfeeding. The following recommendations can help keep your breasts moisturized and healthy:  Avoid using soap on your nipples.  Wear a supportive bra. Although not required, special nursing bras and tank tops are designed to allow access to your breasts for breastfeeding without taking off your entire bra or top. Avoid wearing underwire-style bras or extremely tight bras.  Air dry your nipples for 3-4minutes after each feeding.  Use only cotton bra pads to absorb leaked breast milk. Leaking of breast milk between feedings is normal.  Use lanolin on your nipples after breastfeeding. Lanolin helps to maintain your skin's normal moisture barrier. If you use pure lanolin, you do not need to wash it off before feeding your baby again. Pure lanolin is not toxic to your baby. You may also hand express a few drops of breast milk and gently massage that milk into your nipples and allow the milk to air dry. In the first few weeks after giving birth, some women experience extremely full breasts (engorgement). Engorgement can make your breasts feel heavy, warm, and tender to the touch. Engorgement peaks within 3-5 days after you give birth. The following recommendations can help ease engorgement:  Completely empty your breasts while breastfeeding or pumping. You may want to start by applying warm, moist heat (in the shower or with warm water-soaked hand towels) just before feeding or pumping. This increases circulation and helps the milk   flow. If your baby does not completely empty your breasts while breastfeeding, pump any extra milk after he or she is finished.  Wear a snug bra (nursing or regular) or tank top for 1-2 days to signal your body  to slightly decrease milk production.  Apply ice packs to your breasts, unless this is too uncomfortable for you.  Make sure that your baby is latched on and positioned properly while breastfeeding. If engorgement persists after 48 hours of following these recommendations, contact your health care provider or a lactation consultant. OVERALL HEALTH CARE RECOMMENDATIONS WHILE BREASTFEEDING  Eat healthy foods. Alternate between meals and snacks, eating 3 of each per day. Because what you eat affects your breast milk, some of the foods may make your baby more irritable than usual. Avoid eating these foods if you are sure that they are negatively affecting your baby.  Drink milk, fruit juice, and water to satisfy your thirst (about 10 glasses a day).  Rest often, relax, and continue to take your prenatal vitamins to prevent fatigue, stress, and anemia.  Continue breast self-awareness checks.  Avoid chewing and smoking tobacco. Chemicals from cigarettes that pass into breast milk and exposure to secondhand smoke may harm your baby.  Avoid alcohol and drug use, including marijuana. Some medicines that may be harmful to your baby can pass through breast milk. It is important to ask your health care provider before taking any medicine, including all over-the-counter and prescription medicine as well as vitamin and herbal supplements. It is possible to become pregnant while breastfeeding. If birth control is desired, ask your health care provider about options that will be safe for your baby. SEEK MEDICAL CARE IF:  You feel like you want to stop breastfeeding or have become frustrated with breastfeeding.  You have painful breasts or nipples.  Your nipples are cracked or bleeding.  Your breasts are red, tender, or warm.  You have a swollen area on either breast.  You have a fever or chills.  You have nausea or vomiting.  You have drainage other than breast milk from your nipples.  Your  breasts do not become full before feedings by the fifth day after you give birth.  You feel sad and depressed.  Your baby is too sleepy to eat well.  Your baby is having trouble sleeping.   Your baby is wetting less than 3 diapers in a 24-hour period.  Your baby has less than 3 stools in a 24-hour period.  Your baby's skin or the white part of his or her eyes becomes yellow.   Your baby is not gaining weight by 5 days of age. SEEK IMMEDIATE MEDICAL CARE IF:  Your baby is overly tired (lethargic) and does not want to wake up and feed.  Your baby develops an unexplained fever.   This information is not intended to replace advice given to you by your health care provider. Make sure you discuss any questions you have with your health care provider.   Document Released: 06/24/2005 Document Revised: 03/15/2015 Document Reviewed: 12/16/2012 Elsevier Interactive Patient Education 2016 [employer]  

## 2015-05-29 LAB — PRENATAL PROFILE (SOLSTAS)
Antibody Screen: NEGATIVE
BASOS ABS: 0 10*3/uL (ref 0.0–0.1)
Basophils Relative: 0 % (ref 0–1)
EOS PCT: 0 % (ref 0–5)
Eosinophils Absolute: 0 10*3/uL (ref 0.0–0.7)
HEMATOCRIT: 35.6 % — AB (ref 36.0–46.0)
HIV: NONREACTIVE
Hemoglobin: 11.9 g/dL — ABNORMAL LOW (ref 12.0–15.0)
Hepatitis B Surface Ag: NEGATIVE
LYMPHS ABS: 1.8 10*3/uL (ref 0.7–4.0)
LYMPHS PCT: 26 % (ref 12–46)
MCH: 29.5 pg (ref 26.0–34.0)
MCHC: 33.4 g/dL (ref 30.0–36.0)
MCV: 88.1 fL (ref 78.0–100.0)
MPV: 11 fL (ref 8.6–12.4)
Monocytes Absolute: 0.3 10*3/uL (ref 0.1–1.0)
Monocytes Relative: 5 % (ref 3–12)
NEUTROS ABS: 4.8 10*3/uL (ref 1.7–7.7)
NEUTROS PCT: 69 % (ref 43–77)
PLATELETS: 194 10*3/uL (ref 150–400)
RBC: 4.04 MIL/uL (ref 3.87–5.11)
RDW: 14.3 % (ref 11.5–15.5)
RH TYPE: POSITIVE
Rubella: 2.06 Index — ABNORMAL HIGH (ref <0.90)
WBC: 6.9 10*3/uL (ref 4.0–10.5)

## 2015-05-29 LAB — URINE CYTOLOGY ANCILLARY ONLY
Chlamydia: NEGATIVE
NEISSERIA GONORRHEA: NEGATIVE

## 2015-06-14 ENCOUNTER — Other Ambulatory Visit: Payer: Self-pay | Admitting: Family Medicine

## 2015-06-14 ENCOUNTER — Ambulatory Visit (HOSPITAL_COMMUNITY)
Admission: RE | Admit: 2015-06-14 | Discharge: 2015-06-14 | Disposition: A | Discharge status: Home / Self Care | Payer: Managed Care, Other (non HMO) | Source: Ambulatory Visit | Attending: Family Medicine | Admitting: Family Medicine

## 2015-06-14 DIAGNOSIS — O09292 Supervision of pregnancy with other poor reproductive or obstetric history, second trimester: Secondary | ICD-10-CM

## 2015-06-14 DIAGNOSIS — Z36 Encounter for antenatal screening of mother: Secondary | ICD-10-CM | POA: Diagnosis not present

## 2015-06-14 DIAGNOSIS — Z3482 Encounter for supervision of other normal pregnancy, second trimester: Secondary | ICD-10-CM

## 2015-06-14 DIAGNOSIS — O09892 Supervision of other high risk pregnancies, second trimester: Secondary | ICD-10-CM | POA: Diagnosis not present

## 2015-06-14 DIAGNOSIS — Z3A19 19 weeks gestation of pregnancy: Secondary | ICD-10-CM | POA: Diagnosis not present

## 2015-06-14 DIAGNOSIS — Z8632 Personal history of gestational diabetes: Secondary | ICD-10-CM

## 2015-06-14 DIAGNOSIS — Z1389 Encounter for screening for other disorder: Secondary | ICD-10-CM

## 2015-06-23 ENCOUNTER — Ambulatory Visit (INDEPENDENT_AMBULATORY_CARE_PROVIDER_SITE_OTHER): Payer: Managed Care, Other (non HMO) | Admitting: Family Medicine

## 2015-06-23 VITALS — BP 129/83 | HR 89 | Wt 148.0 lb

## 2015-06-23 DIAGNOSIS — Z3482 Encounter for supervision of other normal pregnancy, second trimester: Secondary | ICD-10-CM

## 2015-06-23 NOTE — Patient Instructions (Signed)
Second Trimester of Pregnancy The second trimester is from week 13 through week 28, months 4 through 6. The second trimester is often a time when you feel your best. Your body has also adjusted to being pregnant, and you begin to feel better physically. Usually, morning sickness has lessened or quit completely, you may have more energy, and you may have an increase in appetite. The second trimester is also a time when the fetus is growing rapidly. At the end of the sixth month, the fetus is about 9 inches long and weighs about 1 pounds. You will likely begin to feel the baby move (quickening) between 18 and 20 weeks of the pregnancy. BODY CHANGES Your body goes through many changes during pregnancy. The changes vary from woman to woman.   Your weight will continue to increase. You will notice your lower abdomen bulging out.  You may begin to get stretch marks on your hips, abdomen, and breasts.  You may develop headaches that can be relieved by medicines approved by your health care provider.  You may urinate more often because the fetus is pressing on your bladder.  You may develop or continue to have heartburn as a result of your pregnancy.  You may develop constipation because certain hormones are causing the muscles that push waste through your intestines to slow down.  You may develop hemorrhoids or swollen, bulging veins (varicose veins).  You may have back pain because of the weight gain and pregnancy hormones relaxing your joints between the bones in your pelvis and as a result of a shift in weight and the muscles that support your balance.  Your breasts will continue to grow and be tender.  Your gums may bleed and may be sensitive to brushing and flossing.  Dark spots or blotches (chloasma, mask of pregnancy) may develop on your face. This will likely fade after the baby is born.  A dark line from your belly button to the pubic area (linea nigra) may appear. This will likely  fade after the baby is born.  You may have changes in your hair. These can include thickening of your hair, rapid growth, and changes in texture. Some women also have hair loss during or after pregnancy, or hair that feels dry or thin. Your hair will most likely return to normal after your baby is born. WHAT TO EXPECT AT YOUR PRENATAL VISITS During a routine prenatal visit:  You will be weighed to make sure you and the fetus are growing normally.  Your blood pressure will be taken.  Your abdomen will be measured to track your baby's growth.  The fetal heartbeat will be listened to.  Any test results from the previous visit will be discussed. Your health care provider may ask you:  How you are feeling.  If you are feeling the baby move.  If you have had any abnormal symptoms, such as leaking fluid, bleeding, severe headaches, or abdominal cramping.  If you are using any tobacco products, including cigarettes, chewing tobacco, and electronic cigarettes.  If you have any questions. Other tests that may be performed during your second trimester include:  Blood tests that check for:  Low iron levels (anemia).  Gestational diabetes (between 24 and 28 weeks).  Rh antibodies.  Urine tests to check for infections, diabetes, or protein in the urine.  An ultrasound to confirm the proper growth and development of the baby.  An amniocentesis to check for possible genetic problems.  Fetal screens for spina bifida   and Down syndrome.  HIV (human immunodeficiency virus) testing. Routine prenatal testing includes screening for HIV, unless you choose not to have this test. HOME CARE INSTRUCTIONS   Avoid all smoking, herbs, alcohol, and unprescribed drugs. These chemicals affect the formation and growth of the baby.  Do not use any tobacco products, including cigarettes, chewing tobacco, and electronic cigarettes. If you need help quitting, ask your health care provider. You may receive  counseling support and other resources to help you quit.  Follow your health care provider's instructions regarding medicine use. There are medicines that are either safe or unsafe to take during pregnancy.  Exercise only as directed by your health care provider. Experiencing uterine cramps is a good sign to stop exercising.  Continue to eat regular, healthy meals.  Wear a good support bra for breast tenderness.  Do not use hot tubs, steam rooms, or saunas.  Wear your seat belt at all times when driving.  Avoid raw meat, uncooked cheese, cat litter boxes, and soil used by cats. These carry germs that can cause birth defects in the baby.  Take your prenatal vitamins.  Take 1500-2000 mg of calcium daily starting at the 20th week of pregnancy until you deliver your baby.  Try taking a stool softener (if your health care provider approves) if you develop constipation. Eat more high-fiber foods, such as fresh vegetables or fruit and whole grains. Drink plenty of fluids to keep your urine clear or pale yellow.  Take warm sitz baths to soothe any pain or discomfort caused by hemorrhoids. Use hemorrhoid cream if your health care provider approves.  If you develop varicose veins, wear support hose. Elevate your feet for 15 minutes, 3-4 times a day. Limit salt in your diet.  Avoid heavy lifting, wear low heel shoes, and practice good posture.  Rest with your legs elevated if you have leg cramps or low back pain.  Visit your dentist if you have not gone yet during your pregnancy. Use a soft toothbrush to brush your teeth and be gentle when you floss.  A sexual relationship may be continued unless your health care provider directs you otherwise.  Continue to go to all your prenatal visits as directed by your health care provider. SEEK MEDICAL CARE IF:   You have dizziness.  You have mild pelvic cramps, pelvic pressure, or nagging pain in the abdominal area.  You have persistent nausea,  vomiting, or diarrhea.  You have a bad smelling vaginal discharge.  You have pain with urination. SEEK IMMEDIATE MEDICAL CARE IF:   You have a fever.  You are leaking fluid from your vagina.  You have spotting or bleeding from your vagina.  You have severe abdominal cramping or pain.  You have rapid weight gain or loss.  You have shortness of breath with chest pain.  You notice sudden or extreme swelling of your face, hands, ankles, feet, or legs.  You have not felt your baby move in over an hour.  You have severe headaches that do not go away with medicine.  You have vision changes.   This information is not intended to replace advice given to you by your health care provider. Make sure you discuss any questions you have with your health care provider.   Document Released: 06/18/2001 Document Revised: 07/15/2014 Document Reviewed: 08/25/2012 Elsevier Interactive Patient Education 2016 [employer]   Breastfeeding Deciding to breastfeed is one of the best choices you can make for you and your baby. A change   in hormones during pregnancy causes your breast tissue to grow and increases the number and size of your milk ducts. These hormones also allow proteins, sugars, and fats from your blood supply to make breast milk in your milk-producing glands. Hormones prevent breast milk from being released before your baby is born as well as prompt milk flow after birth. Once breastfeeding has begun, thoughts of your baby, as well as his or her sucking or crying, can stimulate the release of milk from your milk-producing glands.  BENEFITS OF BREASTFEEDING For Your Baby  Your first milk (colostrum) helps your baby's digestive system function better.  There are antibodies in your milk that help your baby fight off infections.  Your baby has a lower incidence of asthma, allergies, and sudden infant death syndrome.  The nutrients in breast milk are better for your baby than infant  formulas and are designed uniquely for your baby's needs.  Breast milk improves your baby's brain development.  Your baby is less likely to develop other conditions, such as childhood obesity, asthma, or type 2 diabetes mellitus. For You  Breastfeeding helps to create a very special bond between you and your baby.  Breastfeeding is convenient. Breast milk is always available at the correct temperature and costs nothing.  Breastfeeding helps to burn calories and helps you lose the weight gained during pregnancy.  Breastfeeding makes your uterus contract to its prepregnancy size faster and slows bleeding (lochia) after you give birth.   Breastfeeding helps to lower your risk of developing type 2 diabetes mellitus, osteoporosis, and breast or ovarian cancer later in life. SIGNS THAT YOUR BABY IS HUNGRY Early Signs of Hunger  Increased alertness or activity.  Stretching.  Movement of the head from side to side.  Movement of the head and opening of the mouth when the corner of the mouth or cheek is stroked (rooting).  Increased sucking sounds, smacking lips, cooing, sighing, or squeaking.  Hand-to-mouth movements.  Increased sucking of fingers or hands. Late Signs of Hunger  Fussing.  Intermittent crying. Extreme Signs of Hunger Signs of extreme hunger will require calming and consoling before your baby will be able to breastfeed successfully. Do not wait for the following signs of extreme hunger to occur before you initiate breastfeeding:  Restlessness.  A loud, strong cry.  Screaming. BREASTFEEDING BASICS Breastfeeding Initiation  Find a comfortable place to sit or lie down, with your neck and back well supported.  Place a pillow or rolled up blanket under your baby to bring him or her to the level of your breast (if you are seated). Nursing pillows are specially designed to help support your arms and your baby while you breastfeed.  Make sure that your baby's  abdomen is facing your abdomen.  Gently massage your breast. With your fingertips, massage from your chest wall toward your nipple in a circular motion. This encourages milk flow. You may need to continue this action during the feeding if your milk flows slowly.  Support your breast with 4 fingers underneath and your thumb above your nipple. Make sure your fingers are well away from your nipple and your baby's mouth.  Stroke your baby's lips gently with your finger or nipple.  When your baby's mouth is open wide enough, quickly bring your baby to your breast, placing your entire nipple and as much of the colored area around your nipple (areola) as possible into your baby's mouth.  More areola should be visible above your baby's upper lip than   below the lower lip.  Your baby's tongue should be between his or her lower gum and your breast.  Ensure that your baby's mouth is correctly positioned around your nipple (latched). Your baby's lips should create a seal on your breast and be turned out (everted).  It is common for your baby to suck about 2-3 minutes in order to start the flow of breast milk. Latching Teaching your baby how to latch on to your breast properly is very important. An improper latch can cause nipple pain and decreased milk supply for you and poor weight gain in your baby. Also, if your baby is not latched onto your nipple properly, he or she may swallow some air during feeding. This can make your baby fussy. Burping your baby when you switch breasts during the feeding can help to get rid of the air. However, teaching your baby to latch on properly is still the best way to prevent fussiness from swallowing air while breastfeeding. Signs that your baby has successfully latched on to your nipple:  Silent tugging or silent sucking, without causing you pain.  Swallowing heard between every 3-4 sucks.  Muscle movement above and in front of his or her ears while sucking. Signs  that your baby has not successfully latched on to nipple:  Sucking sounds or smacking sounds from your baby while breastfeeding.  Nipple pain. If you think your baby has not latched on correctly, slip your finger into the corner of your baby's mouth to break the suction and place it between your baby's gums. Attempt breastfeeding initiation again. Signs of Successful Breastfeeding Signs from your baby:  A gradual decrease in the number of sucks or complete cessation of sucking.  Falling asleep.  Relaxation of his or her body.  Retention of a small amount of milk in his or her mouth.  Letting go of your breast by himself or herself. Signs from you:  Breasts that have increased in firmness, weight, and size 1-3 hours after feeding.  Breasts that are softer immediately after breastfeeding.  Increased milk volume, as well as a change in milk consistency and color by the fifth day of breastfeeding.  Nipples that are not sore, cracked, or bleeding. Signs That Your Baby is Getting Enough Milk  Wetting at least 3 diapers in a 24-hour period. The urine should be clear and pale yellow by age 5 days.  At least 3 stools in a 24-hour period by age 5 days. The stool should be soft and yellow.  At least 3 stools in a 24-hour period by age 7 days. The stool should be seedy and yellow.  No loss of weight greater than 10% of birth weight during the first 3 days of age.  Average weight gain of 4-7 ounces (113-198 g) per week after age 4 days.  Consistent daily weight gain by age 5 days, without weight loss after the age of 2 weeks. After a feeding, your baby may spit up a small amount. This is common. BREASTFEEDING FREQUENCY AND DURATION Frequent feeding will help you make more milk and can prevent sore nipples and breast engorgement. Breastfeed when you feel the need to reduce the fullness of your breasts or when your baby shows signs of hunger. This is called "breastfeeding on demand." Avoid  introducing a pacifier to your baby while you are working to establish breastfeeding (the first 4-6 weeks after your baby is born). After this time you may choose to use a pacifier. Research has shown that   pacifier use during the first year of a baby's life decreases the risk of sudden infant death syndrome (SIDS). Allow your baby to feed on each breast as long as he or she wants. Breastfeed until your baby is finished feeding. When your baby unlatches or falls asleep while feeding from the first breast, offer the second breast. Because newborns are often sleepy in the first few weeks of life, you may need to awaken your baby to get him or her to feed. Breastfeeding times will vary from baby to baby. However, the following rules can serve as a guide to help you ensure that your baby is properly fed:  Newborns (babies 4 weeks of age or younger) may breastfeed every 1-3 hours.  Newborns should not go longer than 3 hours during the day or 5 hours during the night without breastfeeding.  You should breastfeed your baby a minimum of 8 times in a 24-hour period until you begin to introduce solid foods to your baby at around 6 months of age. BREAST MILK PUMPING Pumping and storing breast milk allows you to ensure that your baby is exclusively fed your breast milk, even at times when you are unable to breastfeed. This is especially important if you are going back to work while you are still breastfeeding or when you are not able to be present during feedings. Your lactation consultant can give you guidelines on how long it is safe to store breast milk. A breast pump is a machine that allows you to pump milk from your breast into a sterile bottle. The pumped breast milk can then be stored in a refrigerator or freezer. Some breast pumps are operated by hand, while others use electricity. Ask your lactation consultant which type will work best for you. Breast pumps can be purchased, but some hospitals and  breastfeeding support groups lease breast pumps on a monthly basis. A lactation consultant can teach you how to hand express breast milk, if you prefer not to use a pump. CARING FOR YOUR BREASTS WHILE YOU BREASTFEED Nipples can become dry, cracked, and sore while breastfeeding. The following recommendations can help keep your breasts moisturized and healthy:  Avoid using soap on your nipples.  Wear a supportive bra. Although not required, special nursing bras and tank tops are designed to allow access to your breasts for breastfeeding without taking off your entire bra or top. Avoid wearing underwire-style bras or extremely tight bras.  Air dry your nipples for 3-4minutes after each feeding.  Use only cotton bra pads to absorb leaked breast milk. Leaking of breast milk between feedings is normal.  Use lanolin on your nipples after breastfeeding. Lanolin helps to maintain your skin's normal moisture barrier. If you use pure lanolin, you do not need to wash it off before feeding your baby again. Pure lanolin is not toxic to your baby. You may also hand express a few drops of breast milk and gently massage that milk into your nipples and allow the milk to air dry. In the first few weeks after giving birth, some women experience extremely full breasts (engorgement). Engorgement can make your breasts feel heavy, warm, and tender to the touch. Engorgement peaks within 3-5 days after you give birth. The following recommendations can help ease engorgement:  Completely empty your breasts while breastfeeding or pumping. You may want to start by applying warm, moist heat (in the shower or with warm water-soaked hand towels) just before feeding or pumping. This increases circulation and helps the milk   flow. If your baby does not completely empty your breasts while breastfeeding, pump any extra milk after he or she is finished.  Wear a snug bra (nursing or regular) or tank top for 1-2 days to signal your body  to slightly decrease milk production.  Apply ice packs to your breasts, unless this is too uncomfortable for you.  Make sure that your baby is latched on and positioned properly while breastfeeding. If engorgement persists after 48 hours of following these recommendations, contact your health care provider or a lactation consultant. OVERALL HEALTH CARE RECOMMENDATIONS WHILE BREASTFEEDING  Eat healthy foods. Alternate between meals and snacks, eating 3 of each per day. Because what you eat affects your breast milk, some of the foods may make your baby more irritable than usual. Avoid eating these foods if you are sure that they are negatively affecting your baby.  Drink milk, fruit juice, and water to satisfy your thirst (about 10 glasses a day).  Rest often, relax, and continue to take your prenatal vitamins to prevent fatigue, stress, and anemia.  Continue breast self-awareness checks.  Avoid chewing and smoking tobacco. Chemicals from cigarettes that pass into breast milk and exposure to secondhand smoke may harm your baby.  Avoid alcohol and drug use, including marijuana. Some medicines that may be harmful to your baby can pass through breast milk. It is important to ask your health care provider before taking any medicine, including all over-the-counter and prescription medicine as well as vitamin and herbal supplements. It is possible to become pregnant while breastfeeding. If birth control is desired, ask your health care provider about options that will be safe for your baby. SEEK MEDICAL CARE IF:  You feel like you want to stop breastfeeding or have become frustrated with breastfeeding.  You have painful breasts or nipples.  Your nipples are cracked or bleeding.  Your breasts are red, tender, or warm.  You have a swollen area on either breast.  You have a fever or chills.  You have nausea or vomiting.  You have drainage other than breast milk from your nipples.  Your  breasts do not become full before feedings by the fifth day after you give birth.  You feel sad and depressed.  Your baby is too sleepy to eat well.  Your baby is having trouble sleeping.   Your baby is wetting less than 3 diapers in a 24-hour period.  Your baby has less than 3 stools in a 24-hour period.  Your baby's skin or the white part of his or her eyes becomes yellow.   Your baby is not gaining weight by 5 days of age. SEEK IMMEDIATE MEDICAL CARE IF:  Your baby is overly tired (lethargic) and does not want to wake up and feed.  Your baby develops an unexplained fever.   This information is not intended to replace advice given to you by your health care provider. Make sure you discuss any questions you have with your health care provider.   Document Released: 06/24/2005 Document Revised: 03/15/2015 Document Reviewed: 12/16/2012 Elsevier Interactive Patient Education 2016 [employer]  

## 2015-06-26 NOTE — Progress Notes (Signed)
Subjective:  Jordan Preston is a 29 y.o. G2P1001 at 10769w6d being seen today for ongoing prenatal care.  She is currently monitored for the following issues for this low-risk pregnancy and has Supervision of normal subsequent pregnancy; Short interval between pregnancies affecting pregnancy in first trimester, antepartum; and History of gestational hypertension on her problem list.  Patient reports no complaints.  Contractions: Not present. Vag. Bleeding: None.  Movement: Present. Denies leaking of fluid.   The following portions of the patient's history were reviewed and updated as appropriate: allergies, current medications, past family history, past medical history, past social history, past surgical history and problem list. Problem list updated.  Objective:   Filed Vitals:   06/23/15 0932  BP: 129/83  Pulse: 89  Weight: 148 lb (67.132 kg)    Fetal Status: Fetal Heart Rate (bpm): 151   Movement: Present     General:  Alert, oriented and cooperative. Patient is in no acute distress.  Skin: Skin is warm and dry. No rash noted.   Cardiovascular: Normal heart rate noted  Respiratory: Normal respiratory effort, no problems with respiration noted  Abdomen: Soft, gravid, appropriate for gestational age. Pain/Pressure: Absent     Pelvic: Vag. Bleeding: None Vag D/C Character: Thin   Cervical exam deferred        Extremities: Normal range of motion.  Edema: None  Mental Status: Normal mood and affect. Normal behavior. Normal judgment and thought content.   Urinalysis: Urine Protein: Trace Urine Glucose: 1+  Assessment and Plan:  Pregnancy: G2P1001 at 3269w6d  1. Encounter for supervision of other normal pregnancy in second trimester Continue routine prenatal care.   Preterm labor symptoms and general obstetric precautions including but not limited to vaginal bleeding, contractions, leaking of fluid and fetal movement were reviewed in detail with the patient. Please refer to After Visit  Summary for other counseling recommendations.  Return in 4 weeks (on 07/21/2015).   Reva Boresanya S Sharvi Mooneyhan, MD

## 2015-07-09 NOTE — L&D Delivery Note (Signed)
Delivery Note At 18:12 a viable female was delivered via  NSVD.  APGAR: 9, 9; weight 3634g.   Placenta status: intact, spontaneous.  Cord: 3-vessel with the following complications: none.  Cord pH: not obtained  Anesthesia: epidural  Episiotomy: none Lacerations: 2nd degree perineal Suture Repair: 3.0 vicryl Est. Blood Loss (mL): 100cc  Mom to postpartum.  Baby to Couplet care / Skin to Skin.  Jordan Preston 10/30/2015, 6:41 PM

## 2015-07-21 ENCOUNTER — Ambulatory Visit (INDEPENDENT_AMBULATORY_CARE_PROVIDER_SITE_OTHER): Payer: Managed Care, Other (non HMO) | Admitting: Family Medicine

## 2015-07-21 ENCOUNTER — Encounter: Payer: Self-pay | Admitting: Family Medicine

## 2015-07-21 VITALS — BP 133/87 | HR 102 | Wt 160.0 lb

## 2015-07-21 DIAGNOSIS — O26892 Other specified pregnancy related conditions, second trimester: Secondary | ICD-10-CM

## 2015-07-21 DIAGNOSIS — IMO0001 Reserved for inherently not codable concepts without codable children: Secondary | ICD-10-CM

## 2015-07-21 DIAGNOSIS — Z3482 Encounter for supervision of other normal pregnancy, second trimester: Secondary | ICD-10-CM

## 2015-07-21 DIAGNOSIS — O358XX0 Maternal care for other (suspected) fetal abnormality and damage, not applicable or unspecified: Secondary | ICD-10-CM

## 2015-07-21 DIAGNOSIS — M545 Low back pain: Secondary | ICD-10-CM

## 2015-07-21 NOTE — Progress Notes (Signed)
Subjective:  Jordan SewerSarah Preston is a 30 y.o. G2P1001 at 3859w3d being seen today for ongoing prenatal care.  She is currently monitored for the following issues for this low-risk pregnancy and has Supervision of normal subsequent pregnancy; Short interval between pregnancies affecting pregnancy in first trimester, antepartum; and History of gestational hypertension on her problem list.  Patient reports no complaints.  Contractions: Not present. Vag. Bleeding: None.  Movement: Present. Denies leaking of fluid.   The following portions of the patient's history were reviewed and updated as appropriate: allergies, current medications, past family history, past medical history, past social history, past surgical history and problem list. Problem list updated.  Objective:   Filed Vitals:   07/21/15 0930  BP: 133/87  Pulse: 102  Weight: 160 lb (72.576 kg)    Fetal Status: Fetal Heart Rate (bpm): 149   Movement: Present     General:  Alert, oriented and cooperative. Patient is in no acute distress.  Skin: Skin is warm and dry. No rash noted.   Cardiovascular: Normal heart rate noted  Respiratory: Normal respiratory effort, no problems with respiration noted  Abdomen: Soft, gravid, appropriate for gestational age. Pain/Pressure: Absent     Pelvic: Vag. Bleeding: None Vag D/C Character: Thin   Cervical exam deferred        Extremities: Normal range of motion.  Edema: None  Mental Status: Normal mood and affect. Normal behavior. Normal judgment and thought content.   Urinalysis: Urine Protein: Negative Urine Glucose: Negative  Assessment and Plan:  Pregnancy: G2P1001 at 6559w3d  1. Encounter for supervision of other normal pregnancy in second trimester Continue routine prenatal care. 28 wk labs at next visit.  2. Pregnancy related low back pain in second trimester, antepartum  - Ambulatory referral to Physical Therapy  3. Fetal renal anomaly, not applicable or unspecified fetus Urinary Tract  Dilation for f/u ? 28-30 wks - US MFM OB FOLLOW UP; Future  Preterm labor symptoms and general obstetric precautions including but not limited to vaginal bleeding, contractions, leaking of fluid and fetal movement were reviewed in detail with the patient. Please refer to After Visit Summary for other counseling recommendations.  Return in 4 weeks (on 08/18/2015) for 28 wk labs, ob visit.   Reva Boresanya S Marilena Trevathan, MD

## 2015-07-21 NOTE — Patient Instructions (Signed)
Breastfeeding Deciding to breastfeed is one of the best choices you can make for you and your baby. A change in hormones during pregnancy causes your breast tissue to grow and increases the number and size of your milk ducts. These hormones also allow proteins, sugars, and fats from your blood supply to make breast milk in your milk-producing glands. Hormones prevent breast milk from being released before your baby is born as well as prompt milk flow after birth. Once breastfeeding has begun, thoughts of your baby, as well as his or her sucking or crying, can stimulate the release of milk from your milk-producing glands.  BENEFITS OF BREASTFEEDING For Your Baby  Your first milk (colostrum) helps your baby's digestive system function better.  There are antibodies in your milk that help your baby fight off infections.  Your baby has a lower incidence of asthma, allergies, and sudden infant death syndrome.  The nutrients in breast milk are better for your baby than infant formulas and are designed uniquely for your baby's needs.  Breast milk improves your baby's brain development.  Your baby is less likely to develop other conditions, such as childhood obesity, asthma, or type 2 diabetes mellitus. For You  Breastfeeding helps to create a very special bond between you and your baby.  Breastfeeding is convenient. Breast milk is always available at the correct temperature and costs nothing.  Breastfeeding helps to burn calories and helps you lose the weight gained during pregnancy.  Breastfeeding makes your uterus contract to its prepregnancy size faster and slows bleeding (lochia) after you give birth.   Breastfeeding helps to lower your risk of developing type 2 diabetes mellitus, osteoporosis, and breast or ovarian cancer later in life. SIGNS THAT YOUR BABY IS HUNGRY Early Signs of Hunger  Increased alertness or activity.  Stretching.  Movement of the head from side to  side.  Movement of the head and opening of the mouth when the corner of the mouth or cheek is stroked (rooting).  Increased sucking sounds, smacking lips, cooing, sighing, or squeaking.  Hand-to-mouth movements.  Increased sucking of fingers or hands. Late Signs of Hunger  Fussing.  Intermittent crying. Extreme Signs of Hunger Signs of extreme hunger will require calming and consoling before your baby will be able to breastfeed successfully. Do not wait for the following signs of extreme hunger to occur before you initiate breastfeeding:  Restlessness.  A loud, strong cry.  Screaming. BREASTFEEDING BASICS Breastfeeding Initiation  Find a comfortable place to sit or lie down, with your neck and back well supported.  Place a pillow or rolled up blanket under your baby to bring him or her to the level of your breast (if you are seated). Nursing pillows are specially designed to help support your arms and your baby while you breastfeed.  Make sure that your baby's abdomen is facing your abdomen.  Gently massage your breast. With your fingertips, massage from your chest wall toward your nipple in a circular motion. This encourages milk flow. You may need to continue this action during the feeding if your milk flows slowly.  Support your breast with 4 fingers underneath and your thumb above your nipple. Make sure your fingers are well away from your nipple and your baby's mouth.  Stroke your baby's lips gently with your finger or nipple.  When your baby's mouth is open wide enough, quickly bring your baby to your breast, placing your entire nipple and as much of the colored area around your nipple (  areola) as possible into your baby's mouth.  More areola should be visible above your baby's upper lip than below the lower lip.  Your baby's tongue should be between his or her lower gum and your breast.  Ensure that your baby's mouth is correctly positioned around your nipple  (latched). Your baby's lips should create a seal on your breast and be turned out (everted).  It is common for your baby to suck about 2-3 minutes in order to start the flow of breast milk. Latching Teaching your baby how to latch on to your breast properly is very important. An improper latch can cause nipple pain and decreased milk supply for you and poor weight gain in your baby. Also, if your baby is not latched onto your nipple properly, he or she may swallow some air during feeding. This can make your baby fussy. Burping your baby when you switch breasts during the feeding can help to get rid of the air. However, teaching your baby to latch on properly is still the best way to prevent fussiness from swallowing air while breastfeeding. Signs that your baby has successfully latched on to your nipple:  Silent tugging or silent sucking, without causing you pain.  Swallowing heard between every 3-4 sucks.  Muscle movement above and in front of his or her ears while sucking. Signs that your baby has not successfully latched on to nipple:  Sucking sounds or smacking sounds from your baby while breastfeeding.  Nipple pain. If you think your baby has not latched on correctly, slip your finger into the corner of your baby's mouth to break the suction and place it between your baby's gums. Attempt breastfeeding initiation again. Signs of Successful Breastfeeding Signs from your baby:  A gradual decrease in the number of sucks or complete cessation of sucking.  Falling asleep.  Relaxation of his or her body.  Retention of a small amount of milk in his or her mouth.  Letting go of your breast by himself or herself. Signs from you:  Breasts that have increased in firmness, weight, and size 1-3 hours after feeding.  Breasts that are softer immediately after breastfeeding.  Increased milk volume, as well as a change in milk consistency and color by the fifth day of breastfeeding.  Nipples  that are not sore, cracked, or bleeding. Signs That Your Baby is Getting Enough Milk  Wetting at least 3 diapers in a 24-hour period. The urine should be clear and pale yellow by age 5 days.  At least 3 stools in a 24-hour period by age 5 days. The stool should be soft and yellow.  At least 3 stools in a 24-hour period by age 7 days. The stool should be seedy and yellow.  No loss of weight greater than 10% of birth weight during the first 3 days of age.  Average weight gain of 4-7 ounces (113-198 g) per week after age 4 days.  Consistent daily weight gain by age 5 days, without weight loss after the age of 2 weeks. After a feeding, your baby may spit up a small amount. This is common. BREASTFEEDING FREQUENCY AND DURATION Frequent feeding will help you make more milk and can prevent sore nipples and breast engorgement. Breastfeed when you feel the need to reduce the fullness of your breasts or when your baby shows signs of hunger. This is called "breastfeeding on demand." Avoid introducing a pacifier to your baby while you are working to establish breastfeeding (the first 4-6 weeks   after your baby is born). After this time you may choose to use a pacifier. Research has shown that pacifier use during the first year of a baby's life decreases the risk of sudden infant death syndrome (SIDS). Allow your baby to feed on each breast as long as he or she wants. Breastfeed until your baby is finished feeding. When your baby unlatches or falls asleep while feeding from the first breast, offer the second breast. Because newborns are often sleepy in the first few weeks of life, you may need to awaken your baby to get him or her to feed. Breastfeeding times will vary from baby to baby. However, the following rules can serve as a guide to help you ensure that your baby is properly fed:  Newborns (babies 4 weeks of age or younger) may breastfeed every 1-3 hours.  Newborns should not go longer than 3 hours  during the day or 5 hours during the night without breastfeeding.  You should breastfeed your baby a minimum of 8 times in a 24-hour period until you begin to introduce solid foods to your baby at around 6 months of age. BREAST MILK PUMPING Pumping and storing breast milk allows you to ensure that your baby is exclusively fed your breast milk, even at times when you are unable to breastfeed. This is especially important if you are going back to work while you are still breastfeeding or when you are not able to be present during feedings. Your lactation consultant can give you guidelines on how long it is safe to store breast milk. A breast pump is a machine that allows you to pump milk from your breast into a sterile bottle. The pumped breast milk can then be stored in a refrigerator or freezer. Some breast pumps are operated by hand, while others use electricity. Ask your lactation consultant which type will work best for you. Breast pumps can be purchased, but some hospitals and breastfeeding support groups lease breast pumps on a monthly basis. A lactation consultant can teach you how to hand express breast milk, if you prefer not to use a pump. CARING FOR YOUR BREASTS WHILE YOU BREASTFEED Nipples can become dry, cracked, and sore while breastfeeding. The following recommendations can help keep your breasts moisturized and healthy:  Avoid using soap on your nipples.  Wear a supportive bra. Although not required, special nursing bras and tank tops are designed to allow access to your breasts for breastfeeding without taking off your entire bra or top. Avoid wearing underwire-style bras or extremely tight bras.  Air dry your nipples for 3-4minutes after each feeding.  Use only cotton bra pads to absorb leaked breast milk. Leaking of breast milk between feedings is normal.  Use lanolin on your nipples after breastfeeding. Lanolin helps to maintain your skin's normal moisture barrier. If you use  pure lanolin, you do not need to wash it off before feeding your baby again. Pure lanolin is not toxic to your baby. You may also hand express a few drops of breast milk and gently massage that milk into your nipples and allow the milk to air dry. In the first few weeks after giving birth, some women experience extremely full breasts (engorgement). Engorgement can make your breasts feel heavy, warm, and tender to the touch. Engorgement peaks within 3-5 days after you give birth. The following recommendations can help ease engorgement:  Completely empty your breasts while breastfeeding or pumping. You may want to start by applying warm, moist heat (in   the shower or with warm water-soaked hand towels) just before feeding or pumping. This increases circulation and helps the milk flow. If your baby does not completely empty your breasts while breastfeeding, pump any extra milk after he or she is finished.  Wear a snug bra (nursing or regular) or tank top for 1-2 days to signal your body to slightly decrease milk production.  Apply ice packs to your breasts, unless this is too uncomfortable for you.  Make sure that your baby is latched on and positioned properly while breastfeeding. If engorgement persists after 48 hours of following these recommendations, contact your health care provider or a lactation consultant. OVERALL HEALTH CARE RECOMMENDATIONS WHILE BREASTFEEDING  Eat healthy foods. Alternate between meals and snacks, eating 3 of each per day. Because what you eat affects your breast milk, some of the foods may make your baby more irritable than usual. Avoid eating these foods if you are sure that they are negatively affecting your baby.  Drink milk, fruit juice, and water to satisfy your thirst (about 10 glasses a day).  Rest often, relax, and continue to take your prenatal vitamins to prevent fatigue, stress, and anemia.  Continue breast self-awareness checks.  Avoid chewing and smoking  tobacco. Chemicals from cigarettes that pass into breast milk and exposure to secondhand smoke may harm your baby.  Avoid alcohol and drug use, including marijuana. Some medicines that may be harmful to your baby can pass through breast milk. It is important to ask your health care provider before taking any medicine, including all over-the-counter and prescription medicine as well as vitamin and herbal supplements. It is possible to become pregnant while breastfeeding. If birth control is desired, ask your health care provider about options that will be safe for your baby. SEEK MEDICAL CARE IF:  You feel like you want to stop breastfeeding or have become frustrated with breastfeeding.  You have painful breasts or nipples.  Your nipples are cracked or bleeding.  Your breasts are red, tender, or warm.  You have a swollen area on either breast.  You have a fever or chills.  You have nausea or vomiting.  You have drainage other than breast milk from your nipples.  Your breasts do not become full before feedings by the fifth day after you give birth.  You feel sad and depressed.  Your baby is too sleepy to eat well.  Your baby is having trouble sleeping.   Your baby is wetting less than 3 diapers in a 24-hour period.  Your baby has less than 3 stools in a 24-hour period.  Your baby's skin or the white part of his or her eyes becomes yellow.   Your baby is not gaining weight by 5 days of age. SEEK IMMEDIATE MEDICAL CARE IF:  Your baby is overly tired (lethargic) and does not want to wake up and feed.  Your baby develops an unexplained fever.   This information is not intended to replace advice given to you by your health care provider. Make sure you discuss any questions you have with your health care provider.   Document Released: 06/24/2005 Document Revised: 03/15/2015 Document Reviewed: 12/16/2012 Elsevier Interactive Patient Education 2016 [employer]  

## 2015-07-26 ENCOUNTER — Ambulatory Visit (INDEPENDENT_AMBULATORY_CARE_PROVIDER_SITE_OTHER): Payer: Managed Care, Other (non HMO) | Admitting: Certified Nurse Midwife

## 2015-07-26 VITALS — BP 133/88 | HR 101 | Wt 160.0 lb

## 2015-07-26 DIAGNOSIS — Z8759 Personal history of other complications of pregnancy, childbirth and the puerperium: Secondary | ICD-10-CM

## 2015-07-26 DIAGNOSIS — Z3482 Encounter for supervision of other normal pregnancy, second trimester: Secondary | ICD-10-CM | POA: Diagnosis not present

## 2015-07-26 DIAGNOSIS — N368 Other specified disorders of urethra: Secondary | ICD-10-CM

## 2015-07-26 LAB — COMPREHENSIVE METABOLIC PANEL
ALT: 7 U/L (ref 6–29)
AST: 11 U/L (ref 10–30)
Albumin: 3.4 g/dL — ABNORMAL LOW (ref 3.6–5.1)
Alkaline Phosphatase: 73 U/L (ref 33–115)
BUN: 6 mg/dL — ABNORMAL LOW (ref 7–25)
CO2: 24 mmol/L (ref 20–31)
Calcium: 8.6 mg/dL (ref 8.6–10.2)
Chloride: 106 mmol/L (ref 98–110)
Creat: 0.4 mg/dL — ABNORMAL LOW (ref 0.50–1.10)
Glucose, Bld: 77 mg/dL (ref 65–99)
Potassium: 3.8 mmol/L (ref 3.5–5.3)
Sodium: 138 mmol/L (ref 135–146)
Total Bilirubin: 0.2 mg/dL (ref 0.2–1.2)
Total Protein: 6.5 g/dL (ref 6.1–8.1)

## 2015-07-26 LAB — CBC
HCT: 32 % — ABNORMAL LOW (ref 36.0–46.0)
Hemoglobin: 10.9 g/dL — ABNORMAL LOW (ref 12.0–15.0)
MCH: 30.6 pg (ref 26.0–34.0)
MCHC: 34.1 g/dL (ref 30.0–36.0)
MCV: 89.9 fL (ref 78.0–100.0)
MPV: 11 fL (ref 8.6–12.4)
Platelets: 205 10*3/uL (ref 150–400)
RBC: 3.56 MIL/uL — ABNORMAL LOW (ref 3.87–5.11)
RDW: 14 % (ref 11.5–15.5)
WBC: 9.3 10*3/uL (ref 4.0–10.5)

## 2015-07-26 NOTE — Progress Notes (Signed)
Pt c/o vaginal cyst that she noticed a few days ago that is getting larger.

## 2015-07-26 NOTE — Addendum Note (Signed)
Addended by: Gita Kudo on: 07/26/2015 04:37 PM   Modules accepted: Orders

## 2015-07-26 NOTE — Patient Instructions (Signed)

## 2015-07-26 NOTE — Progress Notes (Signed)
Subjective:  Jordan Preston is a 30 y.o. G2P1001 at [redacted]w[redacted]d being seen today for ongoing prenatal care.  She is currently monitored for the following issues for this high-risk pregnancy and has Supervision of normal subsequent pregnancy; Short interval between pregnancies affecting pregnancy in first trimester, antepartum; History of gestational hypertension; and Urethral cyst on her problem list.  Patient reports vaginal cyst.  Contractions: Not present. Vag. Bleeding: None.  Movement: Present. Denies leaking of fluid. Pt has 2cm  cyst on her urethra.  The following portions of the patient's history were reviewed and updated as appropriate: allergies, current medications, past family history, past medical history, past social history, past surgical history and problem list. Problem list updated.  Objective:   Filed Vitals:   07/26/15 1601  BP: 133/88  Pulse: 101  Weight: 160 lb (72.576 kg)    Fetal Status: Fetal Heart Rate (bpm): 152   Movement: Present     General:  Alert, oriented and cooperative. Patient is in no acute distress.  Skin: Skin is warm and dry. No rash noted.   Cardiovascular: Normal heart rate noted  Respiratory: Normal respiratory effort, no problems with respiration noted  Abdomen: Soft, gravid, appropriate for gestational age. Pain/Pressure: Absent     Pelvic: Vag. Bleeding: None Vag D/C Character: Thin   Cervical exam deferred        Extremities: Normal range of motion.  Edema: None  Mental Status: Normal mood and affect. Normal behavior. Normal judgment and thought content.   Urinalysis:      Assessment and Plan:  Pregnancy: G2P1001 at [redacted]w[redacted]d  1. Encounter for supervision of other normal pregnancy in second trimester  - CBC - COMPLETE METABOLIC PANEL WITH GFR - Protein / Creatinine Ratio, Urine - Consult to urology  2. Urethral cyst Consult urology 3. History of gestational hypertension Get Baseline cbc, cmp. Pr cr  Preterm labor symptoms and general  obstetric precautions including but not limited to vaginal bleeding, contractions, leaking of fluid and fetal movement were reviewed in detail with the patient. Please refer to After Visit Summary for other counseling recommendations.  Return if symptoms worsen or fail to improve.   Rhea Pink, CNM

## 2015-07-27 LAB — PROTEIN / CREATININE RATIO, URINE
Creatinine, Urine: 139 mg/dL (ref 20–320)
Protein Creatinine Ratio: 187 mg/g creat — ABNORMAL HIGH (ref 21–161)
Total Protein, Urine: 26 mg/dL — ABNORMAL HIGH (ref 5–24)

## 2015-08-18 ENCOUNTER — Ambulatory Visit (HOSPITAL_COMMUNITY)
Admission: RE | Admit: 2015-08-18 | Discharge: 2015-08-18 | Disposition: A | Discharge status: Home / Self Care | Payer: Managed Care, Other (non HMO) | Source: Ambulatory Visit | Attending: Family Medicine | Admitting: Family Medicine

## 2015-08-18 ENCOUNTER — Ambulatory Visit (INDEPENDENT_AMBULATORY_CARE_PROVIDER_SITE_OTHER): Payer: Managed Care, Other (non HMO) | Admitting: Obstetrics & Gynecology

## 2015-08-18 ENCOUNTER — Other Ambulatory Visit: Payer: Self-pay | Admitting: Family Medicine

## 2015-08-18 VITALS — BP 138/82 | HR 108 | Wt 168.0 lb

## 2015-08-18 DIAGNOSIS — O09293 Supervision of pregnancy with other poor reproductive or obstetric history, third trimester: Secondary | ICD-10-CM | POA: Diagnosis present

## 2015-08-18 DIAGNOSIS — Z36 Encounter for antenatal screening of mother: Secondary | ICD-10-CM | POA: Diagnosis not present

## 2015-08-18 DIAGNOSIS — Z3483 Encounter for supervision of other normal pregnancy, third trimester: Secondary | ICD-10-CM

## 2015-08-18 DIAGNOSIS — O09893 Supervision of other high risk pregnancies, third trimester: Secondary | ICD-10-CM | POA: Insufficient documentation

## 2015-08-18 DIAGNOSIS — O359XX0 Maternal care for (suspected) fetal abnormality and damage, unspecified, not applicable or unspecified: Secondary | ICD-10-CM

## 2015-08-18 DIAGNOSIS — IMO0001 Reserved for inherently not codable concepts without codable children: Secondary | ICD-10-CM

## 2015-08-18 DIAGNOSIS — Z3A28 28 weeks gestation of pregnancy: Secondary | ICD-10-CM | POA: Diagnosis not present

## 2015-08-18 DIAGNOSIS — O358XX0 Maternal care for other (suspected) fetal abnormality and damage, not applicable or unspecified: Secondary | ICD-10-CM | POA: Diagnosis present

## 2015-08-18 DIAGNOSIS — O09891 Supervision of other high risk pregnancies, first trimester: Secondary | ICD-10-CM

## 2015-08-18 DIAGNOSIS — Z8759 Personal history of other complications of pregnancy, childbirth and the puerperium: Secondary | ICD-10-CM

## 2015-08-18 LAB — CBC
HCT: 32.1 % — ABNORMAL LOW (ref 36.0–46.0)
Hemoglobin: 10.6 g/dL — ABNORMAL LOW (ref 12.0–15.0)
MCH: 29.6 pg (ref 26.0–34.0)
MCHC: 33 g/dL (ref 30.0–36.0)
MCV: 89.7 fL (ref 78.0–100.0)
MPV: 11.7 fL (ref 8.6–12.4)
PLATELETS: 184 10*3/uL (ref 150–400)
RBC: 3.58 MIL/uL — AB (ref 3.87–5.11)
RDW: 13.8 % (ref 11.5–15.5)
WBC: 7.5 10*3/uL (ref 4.0–10.5)

## 2015-08-18 NOTE — Addendum Note (Signed)
Addended by: Gita Kudo on: 08/18/2015 09:25 AM   Modules accepted: Orders

## 2015-08-18 NOTE — Progress Notes (Signed)
Subjective:  Jordan Preston is a 30 y.o. MW G2P1001 at [redacted]w[redacted]d being seen today for ongoing prenatal care.  She is currently monitored for the following issues for this low-risk pregnancy and has Supervision of normal subsequent pregnancy; Short interval between pregnancies affecting pregnancy in first trimester, antepartum; History of gestational hypertension; and Urethral cyst on her problem list.  Patient reports backache. This is long-standing SI joint pain that started prior to her last pregnancy.  Contractions: Not present. Vag. Bleeding: None.  Movement: Present. Denies leaking of fluid.   The following portions of the patient's history were reviewed and updated as appropriate: allergies, current medications, past family history, past medical history, past social history, past surgical history and problem list. Problem list updated.  Objective:   Filed Vitals:   08/18/15 0857  BP: 138/82  Pulse: 108  Weight: 168 lb (76.204 kg)    Fetal Status: Fetal Heart Rate (bpm): 146   Movement: Present     General:  Alert, oriented and cooperative. Patient is in no acute distress.  Skin: Skin is warm and dry. No rash noted.   Cardiovascular: Normal heart rate noted  Respiratory: Normal respiratory effort, no problems with respiration noted  Abdomen: Soft, gravid, appropriate for gestational age. Pain/Pressure: Present     Pelvic: Vag. Bleeding: None Vag D/C Character: Thin   Cervical exam deferred        Extremities: Normal range of motion.  Edema: None  Mental Status: Normal mood and affect. Normal behavior. Normal judgment and thought content.   Urinalysis: Urine Protein: Trace Urine Glucose: Negative  Assessment and Plan:  Pregnancy: G2P1001 at [redacted]w[redacted]d  1. Short interval between pregnancies affecting pregnancy in first trimester, antepartum - I have shown her hip-opening exercises and rec'd a chiro for her SI joint pain  2. History of gestational hypertension - early glucola 54, repeat  glucola today  3. Encounter for supervision of other normal pregnancy in third trimester   Preterm labor symptoms and general obstetric precautions including but not limited to vaginal bleeding, contractions, leaking of fluid and fetal movement were reviewed in detail with the patient. Please refer to After Visit Summary for other counseling recommendations.  Return in about 3 weeks (around 09/08/2015).   Allie Bossier, MD

## 2015-08-19 LAB — GLUCOSE TOLERANCE, 1 HOUR (50G) W/O FASTING: Glucose, 1 Hour GTT: 98 mg/dL (ref 70–140)

## 2015-08-19 LAB — RPR

## 2015-08-19 LAB — HIV ANTIBODY (ROUTINE TESTING W REFLEX): HIV 1&2 Ab, 4th Generation: NONREACTIVE

## 2015-08-22 DIAGNOSIS — IMO0002 Reserved for concepts with insufficient information to code with codable children: Secondary | ICD-10-CM | POA: Insufficient documentation

## 2015-09-08 ENCOUNTER — Ambulatory Visit (INDEPENDENT_AMBULATORY_CARE_PROVIDER_SITE_OTHER): Payer: Managed Care, Other (non HMO) | Admitting: Obstetrics & Gynecology

## 2015-09-08 VITALS — BP 130/83 | HR 114 | Wt 170.0 lb

## 2015-09-08 DIAGNOSIS — Z8759 Personal history of other complications of pregnancy, childbirth and the puerperium: Secondary | ICD-10-CM

## 2015-09-08 DIAGNOSIS — Z3483 Encounter for supervision of other normal pregnancy, third trimester: Secondary | ICD-10-CM

## 2015-09-08 NOTE — Progress Notes (Signed)
Subjective:  Jordan Preston is a 30 y.o. G2P1001 at 5760w3d being seen today for ongoing prenatal care.  She is currently monitored for the following issues for this low-risk pregnancy and has Supervision of normal subsequent pregnancy; Short interval between pregnancies affecting pregnancy in first trimester, antepartum; History of gestational hypertension; Urethral cyst; and Fetal renal anomaly - UTD A2-3 on her problem list.  Patient reports no complaints.  Contractions: Not present. Vag. Bleeding: None.  Movement: Present. Denies leaking of fluid.   The following portions of the patient's history were reviewed and updated as appropriate: allergies, current medications, past family history, past medical history, past social history, past surgical history and problem list. Problem list updated.  Objective:   Filed Vitals:   09/08/15 0856  BP: 130/83  Pulse: 114  Weight: 170 lb (77.111 kg)    Fetal Status: Fetal Heart Rate (bpm): 149   Movement: Present     General:  Alert, oriented and cooperative. Patient is in no acute distress.  Skin: Skin is warm and dry. No rash noted.   Cardiovascular: Normal heart rate noted  Respiratory: Normal respiratory effort, no problems with respiration noted  Abdomen: Soft, gravid, appropriate for gestational age. Pain/Pressure: Present     Pelvic: Vag. Bleeding: None Vag D/C Character: Thin   Cervical exam deferred        Extremities: Normal range of motion.  Edema: None  Mental Status: Normal mood and affect. Normal behavior. Normal judgment and thought content.   Urinalysis:      Assessment and Plan:  Pregnancy: G2P1001 at 960w3d  1. History of gestational hypertension BP stable, will continue to observe  2. Encounter for supervision of other normal pregnancy in third trimester Preterm labor symptoms and general obstetric precautions including but not limited to vaginal bleeding, contractions, leaking of fluid and fetal movement were reviewed in  detail with the patient. Please refer to After Visit Summary for other counseling recommendations.  Return in about 2 weeks (around 09/22/2015) for OB Visit.   Tereso NewcomerUgonna A Jayra Choyce, MD

## 2015-09-08 NOTE — Patient Instructions (Signed)
Return to clinic for any obstetric concerns or go to [facility] for evaluation  

## 2015-09-22 ENCOUNTER — Ambulatory Visit (INDEPENDENT_AMBULATORY_CARE_PROVIDER_SITE_OTHER): Payer: Managed Care, Other (non HMO) | Admitting: Obstetrics & Gynecology

## 2015-09-22 VITALS — BP 133/84 | HR 101 | Wt 170.0 lb

## 2015-09-22 DIAGNOSIS — Z3483 Encounter for supervision of other normal pregnancy, third trimester: Secondary | ICD-10-CM

## 2015-09-22 DIAGNOSIS — Z8759 Personal history of other complications of pregnancy, childbirth and the puerperium: Secondary | ICD-10-CM

## 2015-09-22 NOTE — Progress Notes (Signed)
Subjective:  Jordan Preston is a 30 y.o. G2P1001 at 9468w3d being seen today for ongoing prenatal care.  She is currently monitored for the following issues for this low-risk pregnancy and has Supervision of normal subsequent pregnancy; Short interval between pregnancies affecting pregnancy in first trimester, antepartum; History of gestational hypertension; Urethral cyst; and Fetal renal anomaly - UTD A2-3 on her problem list.  Patient reports no complaints.  Contractions: Not present. Vag. Bleeding: None.  Movement: Present. Denies leaking of fluid.   The following portions of the patient's history were reviewed and updated as appropriate: allergies, current medications, past family history, past medical history, past social history, past surgical history and problem list. Problem list updated.  Objective:   Filed Vitals:   09/22/15 0955  BP: 133/84  Pulse: 101  Weight: 170 lb (77.111 kg)    Fetal Status: Fetal Heart Rate (bpm): 175 Fundal Height: 33 cm Movement: Present     General:  Alert, oriented and cooperative. Patient is in no acute distress.  Skin: Skin is warm and dry. No rash noted.   Cardiovascular: Normal heart rate noted  Respiratory: Normal respiratory effort, no problems with respiration noted  Abdomen: Soft, gravid, appropriate for gestational age. Pain/Pressure: Absent     Pelvic: Vag. Bleeding: None    Cervical exam deferred        Extremities: Normal range of motion.  Edema: Trace  Mental Status: Normal mood and affect. Normal behavior. Normal judgment and thought content.   Urinalysis: Urine Protein: Trace Urine Glucose: Negative  Assessment and Plan:  Pregnancy: G2P1001 at 6268w3d  1. History of gestational hypertension Continue to monitor BP.  Preeclampsia precautions advised.  2. Encounter for supervision of other normal pregnancy in third trimester Preterm labor symptoms and general obstetric precautions including but not limited to vaginal bleeding,  contractions, leaking of fluid and fetal movement were reviewed in detail with the patient. Please refer to After Visit Summary for other counseling recommendations.  Return in about 2 weeks (around 10/06/2015) for OB Visit.   Tereso NewcomerUgonna A Fabien Travelstead, MD

## 2015-09-22 NOTE — Patient Instructions (Signed)
Return to clinic for any obstetric concerns or go to [facility] for evaluation  

## 2015-09-29 ENCOUNTER — Ambulatory Visit (HOSPITAL_COMMUNITY): Payer: Managed Care, Other (non HMO)

## 2015-10-04 ENCOUNTER — Ambulatory Visit (HOSPITAL_COMMUNITY)
Admission: RE | Admit: 2015-10-04 | Discharge: 2015-10-04 | Disposition: A | Discharge status: Home / Self Care | Payer: Managed Care, Other (non HMO) | Source: Ambulatory Visit | Attending: Maternal and Fetal Medicine | Admitting: Maternal and Fetal Medicine

## 2015-10-04 ENCOUNTER — Encounter (HOSPITAL_COMMUNITY): Payer: Self-pay

## 2015-10-04 DIAGNOSIS — IMO0001 Reserved for inherently not codable concepts without codable children: Secondary | ICD-10-CM

## 2015-10-04 DIAGNOSIS — Z3A35 35 weeks gestation of pregnancy: Secondary | ICD-10-CM | POA: Insufficient documentation

## 2015-10-04 DIAGNOSIS — O358XX0 Maternal care for other (suspected) fetal abnormality and damage, not applicable or unspecified: Secondary | ICD-10-CM | POA: Diagnosis not present

## 2015-10-04 DIAGNOSIS — O09292 Supervision of pregnancy with other poor reproductive or obstetric history, second trimester: Secondary | ICD-10-CM | POA: Diagnosis not present

## 2015-10-04 DIAGNOSIS — O09893 Supervision of other high risk pregnancies, third trimester: Secondary | ICD-10-CM | POA: Insufficient documentation

## 2015-10-06 ENCOUNTER — Ambulatory Visit (INDEPENDENT_AMBULATORY_CARE_PROVIDER_SITE_OTHER): Payer: Managed Care, Other (non HMO) | Admitting: Family Medicine

## 2015-10-06 VITALS — BP 138/86 | HR 106 | Wt 173.0 lb

## 2015-10-06 DIAGNOSIS — O358XX0 Maternal care for other (suspected) fetal abnormality and damage, not applicable or unspecified: Secondary | ICD-10-CM

## 2015-10-06 DIAGNOSIS — IMO0001 Reserved for inherently not codable concepts without codable children: Secondary | ICD-10-CM

## 2015-10-06 DIAGNOSIS — Z3483 Encounter for supervision of other normal pregnancy, third trimester: Secondary | ICD-10-CM

## 2015-10-06 NOTE — Progress Notes (Signed)
Subjective:  Jordan SewerSarah Kohen is a 30 y.o. G2P1001 at 2655w3d being seen today for ongoing prenatal care.  She is currently monitored for the following issues for this low-risk pregnancy and has Supervision of normal subsequent pregnancy; Short interval between pregnancies affecting pregnancy in first trimester, antepartum; History of gestational hypertension; Urethral cyst; and Fetal renal anomaly - UTD A2-3 on her problem list.  Patient reports no complaints.  Contractions: Not present. Vag. Bleeding: None.  Movement: Present. Denies leaking of fluid.   The following portions of the patient's history were reviewed and updated as appropriate: allergies, current medications, past family history, past medical history, past social history, past surgical history and problem list. Problem list updated.  Objective:   Filed Vitals:   10/06/15 0906  BP: 138/86  Pulse: 106  Weight: 173 lb (78.472 kg)    Fetal Status: Fetal Heart Rate (bpm): 142 Fundal Height: 33 cm Movement: Present  Presentation: Vertex  General:  Alert, oriented and cooperative. Patient is in no acute distress.  Skin: Skin is warm and dry. No rash noted.   Cardiovascular: Normal heart rate noted  Respiratory: Normal respiratory effort, no problems with respiration noted  Abdomen: Soft, gravid, appropriate for gestational age. Pain/Pressure: Absent     Pelvic: Vag. Bleeding: None     Cervical exam deferred        Extremities: Normal range of motion.  Edema: Trace  Mental Status: Normal mood and affect. Normal behavior. Normal judgment and thought content.   Urinalysis: Urine Protein: Negative Urine Glucose: Negative  Assessment and Plan:  Pregnancy: G2P1001 at 2755w3d  1. Encounter for supervision of other normal pregnancy in third trimester Continue routine prenatal care. Cultures next week  2. Fetal renal anomaly, not applicable or unspecified fetus To see Peds Urology next week  Preterm labor symptoms and general obstetric  precautions including but not limited to vaginal bleeding, contractions, leaking of fluid and fetal movement were reviewed in detail with the patient. Please refer to After Visit Summary for other counseling recommendations.  Return in 1 week (on 10/13/2015).   Reva Boresanya S Zaire Levesque, MD

## 2015-10-06 NOTE — Patient Instructions (Signed)
Breastfeeding Deciding to breastfeed is one of the best choices you can make for you and your baby. A change in hormones during pregnancy causes your breast tissue to grow and increases the number and size of your milk ducts. These hormones also allow proteins, sugars, and fats from your blood supply to make breast milk in your milk-producing glands. Hormones prevent breast milk from being released before your baby is born as well as prompt milk flow after birth. Once breastfeeding has begun, thoughts of your baby, as well as his or her sucking or crying, can stimulate the release of milk from your milk-producing glands.  BENEFITS OF BREASTFEEDING For Your Baby  Your first milk (colostrum) helps your baby's digestive system function better.  There are antibodies in your milk that help your baby fight off infections.  Your baby has a lower incidence of asthma, allergies, and sudden infant death syndrome.  The nutrients in breast milk are better for your baby than infant formulas and are designed uniquely for your baby's needs.  Breast milk improves your baby's brain development.  Your baby is less likely to develop other conditions, such as childhood obesity, asthma, or type 2 diabetes mellitus. For You  Breastfeeding helps to create a very special bond between you and your baby.  Breastfeeding is convenient. Breast milk is always available at the correct temperature and costs nothing.  Breastfeeding helps to burn calories and helps you lose the weight gained during pregnancy.  Breastfeeding makes your uterus contract to its prepregnancy size faster and slows bleeding (lochia) after you give birth.   Breastfeeding helps to lower your risk of developing type 2 diabetes mellitus, osteoporosis, and breast or ovarian cancer later in life. SIGNS THAT YOUR BABY IS HUNGRY Early Signs of Hunger  Increased alertness or activity.  Stretching.  Movement of the head from side to  side.  Movement of the head and opening of the mouth when the corner of the mouth or cheek is stroked (rooting).  Increased sucking sounds, smacking lips, cooing, sighing, or squeaking.  Hand-to-mouth movements.  Increased sucking of fingers or hands. Late Signs of Hunger  Fussing.  Intermittent crying. Extreme Signs of Hunger Signs of extreme hunger will require calming and consoling before your baby will be able to breastfeed successfully. Do not wait for the following signs of extreme hunger to occur before you initiate breastfeeding:  Restlessness.  A loud, strong cry.  Screaming. BREASTFEEDING BASICS Breastfeeding Initiation  Find a comfortable place to sit or lie down, with your neck and back well supported.  Place a pillow or rolled up blanket under your baby to bring him or her to the level of your breast (if you are seated). Nursing pillows are specially designed to help support your arms and your baby while you breastfeed.  Make sure that your baby's abdomen is facing your abdomen.  Gently massage your breast. With your fingertips, massage from your chest wall toward your nipple in a circular motion. This encourages milk flow. You may need to continue this action during the feeding if your milk flows slowly.  Support your breast with 4 fingers underneath and your thumb above your nipple. Make sure your fingers are well away from your nipple and your baby's mouth.  Stroke your baby's lips gently with your finger or nipple.  When your baby's mouth is open wide enough, quickly bring your baby to your breast, placing your entire nipple and as much of the colored area around your nipple (  areola) as possible into your baby's mouth.  More areola should be visible above your baby's upper lip than below the lower lip.  Your baby's tongue should be between his or her lower gum and your breast.  Ensure that your baby's mouth is correctly positioned around your nipple  (latched). Your baby's lips should create a seal on your breast and be turned out (everted).  It is common for your baby to suck about 2-3 minutes in order to start the flow of breast milk. Latching Teaching your baby how to latch on to your breast properly is very important. An improper latch can cause nipple pain and decreased milk supply for you and poor weight gain in your baby. Also, if your baby is not latched onto your nipple properly, he or she may swallow some air during feeding. This can make your baby fussy. Burping your baby when you switch breasts during the feeding can help to get rid of the air. However, teaching your baby to latch on properly is still the best way to prevent fussiness from swallowing air while breastfeeding. Signs that your baby has successfully latched on to your nipple:  Silent tugging or silent sucking, without causing you pain.  Swallowing heard between every 3-4 sucks.  Muscle movement above and in front of his or her ears while sucking. Signs that your baby has not successfully latched on to nipple:  Sucking sounds or smacking sounds from your baby while breastfeeding.  Nipple pain. If you think your baby has not latched on correctly, slip your finger into the corner of your baby's mouth to break the suction and place it between your baby's gums. Attempt breastfeeding initiation again. Signs of Successful Breastfeeding Signs from your baby:  A gradual decrease in the number of sucks or complete cessation of sucking.  Falling asleep.  Relaxation of his or her body.  Retention of a small amount of milk in his or her mouth.  Letting go of your breast by himself or herself. Signs from you:  Breasts that have increased in firmness, weight, and size 1-3 hours after feeding.  Breasts that are softer immediately after breastfeeding.  Increased milk volume, as well as a change in milk consistency and color by the fifth day of breastfeeding.  Nipples  that are not sore, cracked, or bleeding. Signs That Your Baby is Getting Enough Milk  Wetting at least 3 diapers in a 24-hour period. The urine should be clear and pale yellow by age 5 days.  At least 3 stools in a 24-hour period by age 5 days. The stool should be soft and yellow.  At least 3 stools in a 24-hour period by age 7 days. The stool should be seedy and yellow.  No loss of weight greater than 10% of birth weight during the first 3 days of age.  Average weight gain of 4-7 ounces (113-198 g) per week after age 4 days.  Consistent daily weight gain by age 5 days, without weight loss after the age of 2 weeks. After a feeding, your baby may spit up a small amount. This is common. BREASTFEEDING FREQUENCY AND DURATION Frequent feeding will help you make more milk and can prevent sore nipples and breast engorgement. Breastfeed when you feel the need to reduce the fullness of your breasts or when your baby shows signs of hunger. This is called "breastfeeding on demand." Avoid introducing a pacifier to your baby while you are working to establish breastfeeding (the first 4-6 weeks   after your baby is born). After this time you may choose to use a pacifier. Research has shown that pacifier use during the first year of a baby's life decreases the risk of sudden infant death syndrome (SIDS). Allow your baby to feed on each breast as long as he or she wants. Breastfeed until your baby is finished feeding. When your baby unlatches or falls asleep while feeding from the first breast, offer the second breast. Because newborns are often sleepy in the first few weeks of life, you may need to awaken your baby to get him or her to feed. Breastfeeding times will vary from baby to baby. However, the following rules can serve as a guide to help you ensure that your baby is properly fed:  Newborns (babies 4 weeks of age or younger) may breastfeed every 1-3 hours.  Newborns should not go longer than 3 hours  during the day or 5 hours during the night without breastfeeding.  You should breastfeed your baby a minimum of 8 times in a 24-hour period until you begin to introduce solid foods to your baby at around 6 months of age. BREAST MILK PUMPING Pumping and storing breast milk allows you to ensure that your baby is exclusively fed your breast milk, even at times when you are unable to breastfeed. This is especially important if you are going back to work while you are still breastfeeding or when you are not able to be present during feedings. Your lactation consultant can give you guidelines on how long it is safe to store breast milk. A breast pump is a machine that allows you to pump milk from your breast into a sterile bottle. The pumped breast milk can then be stored in a refrigerator or freezer. Some breast pumps are operated by hand, while others use electricity. Ask your lactation consultant which type will work best for you. Breast pumps can be purchased, but some hospitals and breastfeeding support groups lease breast pumps on a monthly basis. A lactation consultant can teach you how to hand express breast milk, if you prefer not to use a pump. CARING FOR YOUR BREASTS WHILE YOU BREASTFEED Nipples can become dry, cracked, and sore while breastfeeding. The following recommendations can help keep your breasts moisturized and healthy:  Avoid using soap on your nipples.  Wear a supportive bra. Although not required, special nursing bras and tank tops are designed to allow access to your breasts for breastfeeding without taking off your entire bra or top. Avoid wearing underwire-style bras or extremely tight bras.  Air dry your nipples for 3-4minutes after each feeding.  Use only cotton bra pads to absorb leaked breast milk. Leaking of breast milk between feedings is normal.  Use lanolin on your nipples after breastfeeding. Lanolin helps to maintain your skin's normal moisture barrier. If you use  pure lanolin, you do not need to wash it off before feeding your baby again. Pure lanolin is not toxic to your baby. You may also hand express a few drops of breast milk and gently massage that milk into your nipples and allow the milk to air dry. In the first few weeks after giving birth, some women experience extremely full breasts (engorgement). Engorgement can make your breasts feel heavy, warm, and tender to the touch. Engorgement peaks within 3-5 days after you give birth. The following recommendations can help ease engorgement:  Completely empty your breasts while breastfeeding or pumping. You may want to start by applying warm, moist heat (in   the shower or with warm water-soaked hand towels) just before feeding or pumping. This increases circulation and helps the milk flow. If your baby does not completely empty your breasts while breastfeeding, pump any extra milk after he or she is finished.  Wear a snug bra (nursing or regular) or tank top for 1-2 days to signal your body to slightly decrease milk production.  Apply ice packs to your breasts, unless this is too uncomfortable for you.  Make sure that your baby is latched on and positioned properly while breastfeeding. If engorgement persists after 48 hours of following these recommendations, contact your health care provider or a lactation consultant. OVERALL HEALTH CARE RECOMMENDATIONS WHILE BREASTFEEDING  Eat healthy foods. Alternate between meals and snacks, eating 3 of each per day. Because what you eat affects your breast milk, some of the foods may make your baby more irritable than usual. Avoid eating these foods if you are sure that they are negatively affecting your baby.  Drink milk, fruit juice, and water to satisfy your thirst (about 10 glasses a day).  Rest often, relax, and continue to take your prenatal vitamins to prevent fatigue, stress, and anemia.  Continue breast self-awareness checks.  Avoid chewing and smoking  tobacco. Chemicals from cigarettes that pass into breast milk and exposure to secondhand smoke may harm your baby.  Avoid alcohol and drug use, including marijuana. Some medicines that may be harmful to your baby can pass through breast milk. It is important to ask your health care provider before taking any medicine, including all over-the-counter and prescription medicine as well as vitamin and herbal supplements. It is possible to become pregnant while breastfeeding. If birth control is desired, ask your health care provider about options that will be safe for your baby. SEEK MEDICAL CARE IF:  You feel like you want to stop breastfeeding or have become frustrated with breastfeeding.  You have painful breasts or nipples.  Your nipples are cracked or bleeding.  Your breasts are red, tender, or warm.  You have a swollen area on either breast.  You have a fever or chills.  You have nausea or vomiting.  You have drainage other than breast milk from your nipples.  Your breasts do not become full before feedings by the fifth day after you give birth.  You feel sad and depressed.  Your baby is too sleepy to eat well.  Your baby is having trouble sleeping.   Your baby is wetting less than 3 diapers in a 24-hour period.  Your baby has less than 3 stools in a 24-hour period.  Your baby's skin or the white part of his or her eyes becomes yellow.   Your baby is not gaining weight by 5 days of age. SEEK IMMEDIATE MEDICAL CARE IF:  Your baby is overly tired (lethargic) and does not want to wake up and feed.  Your baby develops an unexplained fever.   This information is not intended to replace advice given to you by your health care provider. Make sure you discuss any questions you have with your health care provider.   Document Released: 06/24/2005 Document Revised: 03/15/2015 Document Reviewed: 12/16/2012 Elsevier Interactive Patient Education 2016 [employer]  

## 2015-10-13 ENCOUNTER — Ambulatory Visit (INDEPENDENT_AMBULATORY_CARE_PROVIDER_SITE_OTHER): Payer: Managed Care, Other (non HMO) | Admitting: Obstetrics & Gynecology

## 2015-10-13 VITALS — BP 124/86 | HR 102 | Wt 174.0 lb

## 2015-10-13 DIAGNOSIS — Z113 Encounter for screening for infections with a predominantly sexual mode of transmission: Secondary | ICD-10-CM

## 2015-10-13 DIAGNOSIS — Z36 Encounter for antenatal screening of mother: Secondary | ICD-10-CM | POA: Diagnosis not present

## 2015-10-13 DIAGNOSIS — IMO0001 Reserved for inherently not codable concepts without codable children: Secondary | ICD-10-CM

## 2015-10-13 DIAGNOSIS — Z3483 Encounter for supervision of other normal pregnancy, third trimester: Secondary | ICD-10-CM

## 2015-10-13 DIAGNOSIS — O358XX0 Maternal care for other (suspected) fetal abnormality and damage, not applicable or unspecified: Secondary | ICD-10-CM

## 2015-10-13 NOTE — Patient Instructions (Signed)
Return to clinic for any scheduled appointments or obstetric concerns, or go to MAU for evaluation  

## 2015-10-13 NOTE — Progress Notes (Signed)
Subjective:  Jordan Preston is a 30 y.o. G2P1001 at 2827w3d being seen today for ongoing prenatal care.  She is currently monitored for the following issues for this high-risk pregnancy and has Supervision of normal subsequent pregnancy; Short interval between pregnancies affecting pregnancy in first trimester, antepartum; History of gestational hypertension; Urethral cyst; and Fetal renal anomaly - UTD A2-3 on her problem list.  Patient reports no complaints.  Contractions: Not present. Vag. Bleeding: None.  Movement: Present. Denies leaking of fluid.   The following portions of the patient's history were reviewed and updated as appropriate: allergies, current medications, past family history, past medical history, past social history, past surgical history and problem list. Problem list updated.  Objective:   Filed Vitals:   10/13/15 1054  BP: 124/86  Pulse: 102  Weight: 174 lb (78.926 kg)    Fetal Status: Fetal Heart Rate (bpm): 147 Fundal Height: 35 cm Movement: Present     General:  Alert, oriented and cooperative. Patient is in no acute distress.  Skin: Skin is warm and dry. No rash noted.   Cardiovascular: Normal heart rate noted  Respiratory: Normal respiratory effort, no problems with respiration noted  Abdomen: Soft, gravid, appropriate for gestational age. Pain/Pressure: Absent     Pelvic: Vag. Bleeding: None Vag D/C Character: Thin    Pelvic cultures done Cervical exam declined by patient; cephalic presentation on u/s last week        Extremities: Normal range of motion.  Edema: Trace  Mental Status: Normal mood and affect. Normal behavior. Normal judgment and thought content.   Urinalysis: Urine Protein: Trace Urine Glucose: Negative  Assessment and Plan:  Pregnancy: G2P1001 at 3427w3d  1. Fetal renal anomaly, not applicable or unspecified fetus 35 weeks R 17mm L 11 mm. Already had Ped Urology consult.  2. Encounter for supervision of other normal pregnancy in third  trimester - GC/Chlam Probe - Culture, beta strep (group b only)  Preterm labor symptoms and general obstetric precautions including but not limited to vaginal bleeding, contractions, leaking of fluid and fetal movement were reviewed in detail with the patient. Please refer to After Visit Summary for other counseling recommendations.  Return in about 1 week (around 10/20/2015) for OB Visit.   Tereso NewcomerUgonna A Emberlee Sortino, MD

## 2015-10-15 LAB — CULTURE, BETA STREP (GROUP B ONLY)

## 2015-10-16 LAB — GC/CHLAMYDIA PROBE AMP (~~LOC~~) NOT AT ARMC
CHLAMYDIA, DNA PROBE: NEGATIVE
NEISSERIA GONORRHEA: NEGATIVE

## 2015-10-19 ENCOUNTER — Encounter: Payer: Managed Care, Other (non HMO) | Admitting: Obstetrics & Gynecology

## 2015-10-27 ENCOUNTER — Encounter (HOSPITAL_COMMUNITY): Payer: Self-pay | Admitting: *Deleted

## 2015-10-27 ENCOUNTER — Ambulatory Visit (INDEPENDENT_AMBULATORY_CARE_PROVIDER_SITE_OTHER): Payer: Managed Care, Other (non HMO) | Admitting: Obstetrics & Gynecology

## 2015-10-27 ENCOUNTER — Inpatient Hospital Stay (EMERGENCY_DEPARTMENT_HOSPITAL)
Admission: AD | Admit: 2015-10-27 | Discharge: 2015-10-27 | Disposition: A | Discharge status: Home / Self Care | Payer: Managed Care, Other (non HMO) | Source: Ambulatory Visit | Attending: Obstetrics & Gynecology | Admitting: Obstetrics & Gynecology

## 2015-10-27 VITALS — BP 140/92 | HR 108 | Wt 177.0 lb

## 2015-10-27 DIAGNOSIS — Z3483 Encounter for supervision of other normal pregnancy, third trimester: Secondary | ICD-10-CM

## 2015-10-27 DIAGNOSIS — IMO0001 Reserved for inherently not codable concepts without codable children: Secondary | ICD-10-CM

## 2015-10-27 DIAGNOSIS — N368 Other specified disorders of urethra: Secondary | ICD-10-CM

## 2015-10-27 DIAGNOSIS — Z8759 Personal history of other complications of pregnancy, childbirth and the puerperium: Secondary | ICD-10-CM

## 2015-10-27 DIAGNOSIS — Z3A38 38 weeks gestation of pregnancy: Secondary | ICD-10-CM | POA: Diagnosis not present

## 2015-10-27 DIAGNOSIS — O358XX0 Maternal care for other (suspected) fetal abnormality and damage, not applicable or unspecified: Secondary | ICD-10-CM

## 2015-10-27 DIAGNOSIS — O134 Gestational [pregnancy-induced] hypertension without significant proteinuria, complicating childbirth: Secondary | ICD-10-CM | POA: Diagnosis not present

## 2015-10-27 DIAGNOSIS — O133 Gestational [pregnancy-induced] hypertension without significant proteinuria, third trimester: Secondary | ICD-10-CM | POA: Diagnosis not present

## 2015-10-27 DIAGNOSIS — O163 Unspecified maternal hypertension, third trimester: Secondary | ICD-10-CM

## 2015-10-27 LAB — URINALYSIS, ROUTINE W REFLEX MICROSCOPIC
BILIRUBIN URINE: NEGATIVE
Glucose, UA: NEGATIVE mg/dL
Hgb urine dipstick: NEGATIVE
KETONES UR: 15 mg/dL — AB
NITRITE: NEGATIVE
Protein, ur: NEGATIVE mg/dL
SPECIFIC GRAVITY, URINE: 1.02 (ref 1.005–1.030)
pH: 6.5 (ref 5.0–8.0)

## 2015-10-27 LAB — URINE MICROSCOPIC-ADD ON: RBC / HPF: NONE SEEN RBC/hpf (ref 0–5)

## 2015-10-27 LAB — COMPREHENSIVE METABOLIC PANEL
ALK PHOS: 140 U/L — AB (ref 38–126)
ALT: 10 U/L — AB (ref 14–54)
AST: 19 U/L (ref 15–41)
Albumin: 2.9 g/dL — ABNORMAL LOW (ref 3.5–5.0)
Anion gap: 8 (ref 5–15)
BUN: 7 mg/dL (ref 6–20)
CHLORIDE: 107 mmol/L (ref 101–111)
CO2: 19 mmol/L — AB (ref 22–32)
CREATININE: 0.44 mg/dL (ref 0.44–1.00)
Calcium: 8.7 mg/dL — ABNORMAL LOW (ref 8.9–10.3)
GFR calc Af Amer: >60 mL/min (ref >60)
GFR calc non Af Amer: >60 mL/min (ref >60)
GLUCOSE: 110 mg/dL — AB (ref 65–99)
Potassium: 3.8 mmol/L (ref 3.5–5.1)
SODIUM: 134 mmol/L — AB (ref 135–145)
Total Bilirubin: 0.2 mg/dL — ABNORMAL LOW (ref 0.3–1.2)
Total Protein: 7.1 g/dL (ref 6.5–8.1)

## 2015-10-27 LAB — CBC
HCT: 30.2 % — ABNORMAL LOW (ref 36.0–46.0)
HEMOGLOBIN: 10.3 g/dL — AB (ref 12.0–15.0)
MCH: 29.5 pg (ref 26.0–34.0)
MCHC: 34.1 g/dL (ref 30.0–36.0)
MCV: 86.5 fL (ref 78.0–100.0)
PLATELETS: 133 10*3/uL — AB (ref 150–400)
RBC: 3.49 MIL/uL — AB (ref 3.87–5.11)
RDW: 14.4 % (ref 11.5–15.5)
WBC: 8.1 10*3/uL (ref 4.0–10.5)

## 2015-10-27 LAB — ABO/RH: ABO/RH(D): O POS

## 2015-10-27 LAB — PROTEIN / CREATININE RATIO, URINE
Creatinine, Urine: 130 mg/dL
Protein Creatinine Ratio: 0.14 mg/mg{Cre} (ref 0.00–0.15)
Total Protein, Urine: 18 mg/dL

## 2015-10-27 LAB — TYPE AND SCREEN
ABO/RH(D): O POS
ANTIBODY SCREEN: NEGATIVE

## 2015-10-27 NOTE — Progress Notes (Signed)
Pt c/o slight headache this morning and occasionally sees spots. BP recheck = 144/95

## 2015-10-27 NOTE — MAU Note (Signed)
Pt was seen in the office today and referred to MAU for PIH eval.  Complains of mild headache in the morning that is now gone, denies epigastric pain, denies visual disturbances.

## 2015-10-27 NOTE — Discharge Instructions (Signed)

## 2015-10-27 NOTE — Progress Notes (Signed)
Subjective:  Jordan SewerSarah Rodrigues is a 30 y.o. MW G2P1001 at [redacted]w[redacted]d being seen today for ongoing prenatal care.  She is currently monitored for the following issues for this high-risk pregnancy and has Supervision of normal subsequent pregnancy; Short interval between pregnancies affecting pregnancy in first trimester, antepartum; History of gestational hypertension; Urethral cyst; and Fetal renal anomaly - UTD A2-3 on her problem list.  Patient reports new onset visual changes this week, new onset headache today. Some new onset nausea today..  Contractions: Not present. Vag. Bleeding: None.  Movement: Present. Denies leaking of fluid.   The following portions of the patient's history were reviewed and updated as appropriate: allergies, current medications, past family history, past medical history, past social history, past surgical history and problem list. Problem list updated.  Objective:   Filed Vitals:   10/27/15 1007  BP: 140/92  Pulse: 108  Weight: 177 lb (80.287 kg)   DTR 3+ BL  Fetal Status: Fetal Heart Rate (bpm): 149   Movement: Present     General:  Alert, oriented and cooperative. Patient is in no acute distress.  Skin: Skin is warm and dry. No rash noted.   Cardiovascular: Normal heart rate noted  Respiratory: Normal respiratory effort, no problems with respiration noted  Abdomen: Soft, gravid, appropriate for gestational age. Pain/Pressure: Absent     Pelvic: Vag. Bleeding: None Vag D/C Character: Thin   Cervical exam performed        Extremities: Normal range of motion.  Edema: Mild pitting, slight indentation  Mental Status: Normal mood and affect. Normal behavior. Normal judgment and thought content.   Urinalysis: Urine Protein: Trace Urine Glucose: Negative  Assessment and Plan:  Pregnancy: G2P1001 at 387w3d  1. Encounter for supervision of other normal pregnancy in third trimester   2. History of gestational hypertension   3. Fetal renal anomaly, not applicable or  unspecified fetus She will go to MAU for labs/NST/rule out Pre eclampsia.  Term labor symptoms and general obstetric precautions including but not limited to vaginal bleeding, contractions, leaking of fluid and fetal movement were reviewed in detail with the patient. Please refer to After Visit Summary for other counseling recommendations.  No Follow-up on file.   Allie BossierMyra C Idora Brosious, MD

## 2015-10-27 NOTE — MAU Provider Note (Signed)
History     CSN: [id number]  Arrival date and time: 10/27/15 1247   First Provider Initiated Contact with Patient 10/27/15 1347      Chief Complaint  Patient presents with  . Hypertension   HPI  Jordan Preston 30 y.o. G2P1001 @ [redacted]w[redacted]d presents from the office for preeclampsia work up. She denies headache, epigastric pain, visual disturbances.  Past Medical History  Diagnosis Date  . Anemia   . Pregnancy induced hypertension     Past Surgical History  Procedure Laterality Date  . No past surgeries      Family History  Problem Relation Age of Onset  . Hypertension Mother   . Hypertension Father   . Cancer Maternal Aunt 11    breast    Social History  Substance Use Topics  . Smoking status: Never Smoker   . Smokeless tobacco: Never Used  . Alcohol Use: No    Allergies: No Known Allergies  Prescriptions prior to admission  Medication Sig Dispense Refill Last Dose  . Prenatal Vit-Fe Fumarate-FA (PRENATAL MULTIVITAMIN) TABS tablet Take 1 tablet by mouth daily.    10/26/2015 at Unknown time    Review of Systems  Constitutional: Negative for fever.  Musculoskeletal:          All other systems reviewed and are negative.  Physical Exam   Blood pressure 130/86, pulse 111, temperature 98.2 F (36.8 C), resp. rate 18, last menstrual period 01/21/2015, unknown if currently breastfeeding.  Physical Exam  Nursing note and vitals reviewed. Constitutional: She is oriented to person, place, and time. She appears well-developed and well-nourished. No distress.  HENT:  Head: Normocephalic and atraumatic.  Cardiovascular: Normal rate and regular rhythm.   Respiratory: Effort normal and breath sounds normal. No respiratory distress.  GI: Soft. There is no tenderness.  Musculoskeletal: Normal range of motion. She exhibits edema.  1+ pitting in bilateral feet  Neurological: She is alert and oriented to person, place, and time. She has normal reflexes.   10/27/15 1434  --   93  --  --  126/82 mmHg  --  --  --  -- AS     10/27/15 1419  --  108  --  --  132/86 mmHg  --  --  --  -- AS    10/27/15 1404  --  111  --  --  130/86 mmHg  --  --  --  -- AS    10/27/15 1349  --  100  --  --  146/79 mmHg  --  --  --  -- AS    10/27/15 1334  --  101  --  --  125/82 mmHg  --  --  --  -- AS    10/27/15 1318  --  109  --  --  136/90 mmHg  --  --  --  -- AS    10/27/15 1312  98.2 F (36.8 C)  108  --  18  139/87 mmHg  --  --  --  -- AS       Results for orders placed or performed during the hospital encounter of 10/27/15 (from the past 24 hour(s))  Urinalysis, Routine w reflex microscopic (not at [facility])     Status: Abnormal   Collection Time: 10/27/15 12:58 PM  Result Value Ref Range   Color, Urine YELLOW YELLOW   APPearance CLEAR CLEAR   Specific Gravity, Urine 1.020 1.005 - 1.030   pH 6.5 5.0 - 8.0   Glucose,  UA NEGATIVE NEGATIVE mg/dL   Hgb urine dipstick NEGATIVE NEGATIVE   Bilirubin Urine NEGATIVE NEGATIVE   Ketones, ur 15 (A) NEGATIVE mg/dL   Protein, ur NEGATIVE NEGATIVE mg/dL   Nitrite NEGATIVE NEGATIVE   Leukocytes, UA TRACE (A) NEGATIVE  Protein / creatinine ratio, urine     Status: None   Collection Time: 10/27/15 12:58 PM  Result Value Ref Range   Creatinine, Urine 130.00 mg/dL   Total Protein, Urine 18 mg/dL   Protein Creatinine Ratio 0.14 0.00 - 0.15 mg/mg[Cre]  Urine microscopic-add on     Status: Abnormal   Collection Time: 10/27/15 12:58 PM  Result Value Ref Range   Squamous Epithelial / LPF 0-5 (A) NONE SEEN   WBC, UA 0-5 0 - 5 WBC/hpf   RBC / HPF NONE SEEN 0 - 5 RBC/hpf   Bacteria, UA RARE (A) NONE SEEN   Urine-Other MUCOUS PRESENT   CBC     Status: Abnormal   Collection Time: 10/27/15  1:26 PM  Result Value Ref Range   WBC 8.1 4.0 - 10.5 K/uL   RBC 3.49 (L) 3.87 - 5.11 MIL/uL   Hemoglobin 10.3 (L) 12.0 - 15.0 g/dL   HCT 16.130.2 (L) 09.636.0 - 04.546.0 %   MCV 86.5 78.0 - 100.0 fL   MCH 29.5 26.0 - 34.0 pg   MCHC 34.1 30.0 - 36.0 g/dL   RDW  40.914.4 81.111.5 - 91.415.5 %   Platelets 133 (L) 150 - 400 K/uL  Comprehensive metabolic panel     Status: Abnormal   Collection Time: 10/27/15  1:26 PM  Result Value Ref Range   Sodium 134 (L) 135 - 145 mmol/L   Potassium 3.8 3.5 - 5.1 mmol/L   Chloride 107 101 - 111 mmol/L   CO2 19 (L) 22 - 32 mmol/L   Glucose, Bld 110 (H) 65 - 99 mg/dL   BUN 7 6 - 20 mg/dL   Creatinine, Ser 7.820.44 0.44 - 1.00 mg/dL   Calcium 8.7 (L) 8.9 - 10.3 mg/dL   Total Protein 7.1 6.5 - 8.1 g/dL   Albumin 2.9 (L) 3.5 - 5.0 g/dL   AST 19 15 - 41 U/L   ALT 10 (L) 14 - 54 U/L   Alkaline Phosphatase 140 (H) 38 - 126 U/L   Total Bilirubin 0.2 (L) 0.3 - 1.2 mg/dL   GFR calc non Af Amer >60 >60 mL/min   GFR calc Af Amer >60 >60 mL/min   Anion gap 8 5 - 15  Type and screen     Status: None   Collection Time: 10/27/15  1:28 PM  Result Value Ref Range   ABO/RH(D) O POS    Antibody Screen NEG    Sample Expiration 10/30/2015    MAU Course  Procedures  MDM Pre E labs within normal limits; with pt demonstrating no CNS symptoms, pt will be discharged home with preeclampsia precautions. [facility] and advised pt that induction for gestational hypertension was likely.FHR reactive-category 1  Having mild contractions. Pt agrees with this POC. Dr Macon LargeAnyanwu aware of POC.  Assessment and Plan  Transient Hypertension in Pregnancy  Discharge  Clemmons,Lori Grissett 10/27/2015, 2:08 PM

## 2015-10-28 LAB — RPR: RPR: NONREACTIVE

## 2015-10-30 ENCOUNTER — Encounter (HOSPITAL_COMMUNITY): Payer: Self-pay | Admitting: *Deleted

## 2015-10-30 ENCOUNTER — Other Ambulatory Visit: Payer: Managed Care, Other (non HMO) | Admitting: *Deleted

## 2015-10-30 ENCOUNTER — Inpatient Hospital Stay (HOSPITAL_COMMUNITY): Payer: Managed Care, Other (non HMO) | Admitting: Anesthesiology

## 2015-10-30 ENCOUNTER — Inpatient Hospital Stay (HOSPITAL_COMMUNITY)
Admission: AD | Admit: 2015-10-30 | Discharge: 2015-11-01 | DRG: 775 | Disposition: A | Discharge status: Home / Self Care | Payer: Managed Care, Other (non HMO) | Source: Ambulatory Visit | Attending: Family Medicine | Admitting: Family Medicine

## 2015-10-30 VITALS — BP 155/92 | HR 97 | Resp 16

## 2015-10-30 DIAGNOSIS — Z8759 Personal history of other complications of pregnancy, childbirth and the puerperium: Secondary | ICD-10-CM

## 2015-10-30 DIAGNOSIS — O134 Gestational [pregnancy-induced] hypertension without significant proteinuria, complicating childbirth: Principal | ICD-10-CM | POA: Diagnosis present

## 2015-10-30 DIAGNOSIS — Z8249 Family history of ischemic heart disease and other diseases of the circulatory system: Secondary | ICD-10-CM | POA: Diagnosis not present

## 2015-10-30 DIAGNOSIS — IMO0001 Reserved for inherently not codable concepts without codable children: Secondary | ICD-10-CM

## 2015-10-30 DIAGNOSIS — Z3A38 38 weeks gestation of pregnancy: Secondary | ICD-10-CM

## 2015-10-30 DIAGNOSIS — N368 Other specified disorders of urethra: Secondary | ICD-10-CM

## 2015-10-30 DIAGNOSIS — Z3490 Encounter for supervision of normal pregnancy, unspecified, unspecified trimester: Secondary | ICD-10-CM

## 2015-10-30 DIAGNOSIS — O358XX0 Maternal care for other (suspected) fetal abnormality and damage, not applicable or unspecified: Secondary | ICD-10-CM

## 2015-10-30 LAB — CBC
HCT: 32.1 % — ABNORMAL LOW (ref 36.0–46.0)
Hemoglobin: 11 g/dL — ABNORMAL LOW (ref 12.0–15.0)
MCH: 29.6 pg (ref 26.0–34.0)
MCHC: 34.3 g/dL (ref 30.0–36.0)
MCV: 86.3 fL (ref 78.0–100.0)
PLATELETS: 138 10*3/uL — AB (ref 150–400)
RBC: 3.72 MIL/uL — AB (ref 3.87–5.11)
RDW: 14.3 % (ref 11.5–15.5)
WBC: 8.5 10*3/uL (ref 4.0–10.5)

## 2015-10-30 LAB — COMPREHENSIVE METABOLIC PANEL
ALK PHOS: 162 U/L — AB (ref 38–126)
ALT: 10 U/L — AB (ref 14–54)
ANION GAP: 9 (ref 5–15)
AST: 17 U/L (ref 15–41)
Albumin: 3 g/dL — ABNORMAL LOW (ref 3.5–5.0)
BUN: 7 mg/dL (ref 6–20)
CALCIUM: 9.1 mg/dL (ref 8.9–10.3)
CO2: 19 mmol/L — AB (ref 22–32)
CREATININE: 0.46 mg/dL (ref 0.44–1.00)
Chloride: 108 mmol/L (ref 101–111)
Glucose, Bld: 76 mg/dL (ref 65–99)
Potassium: 4 mmol/L (ref 3.5–5.1)
SODIUM: 136 mmol/L (ref 135–145)
Total Bilirubin: 0.6 mg/dL (ref 0.3–1.2)
Total Protein: 7 g/dL (ref 6.5–8.1)

## 2015-10-30 LAB — TYPE AND SCREEN
ABO/RH(D): O POS
Antibody Screen: NEGATIVE

## 2015-10-30 LAB — PROTEIN / CREATININE RATIO, URINE
CREATININE, URINE: 92 mg/dL
PROTEIN CREATININE RATIO: 0.15 mg/mg{creat} (ref 0.00–0.15)
TOTAL PROTEIN, URINE: 14 mg/dL

## 2015-10-30 MED ORDER — ZOLPIDEM TARTRATE 5 MG PO TABS: 5.0000 mg | ORAL_TABLET | Freq: Every evening | ORAL | Status: DC | PRN

## 2015-10-30 MED ORDER — OXYCODONE-ACETAMINOPHEN 5-325 MG PO TABS
2.0000 | ORAL_TABLET | ORAL | Status: DC | PRN
Start: 2015-10-30 — End: 2015-11-01

## 2015-10-30 MED ORDER — COCONUT OIL OIL: 1.0000 "application " | TOPICAL_OIL | Status: DC | PRN

## 2015-10-30 MED ORDER — EPHEDRINE 5 MG/ML INJ
10.0000 mg | INTRAVENOUS | Status: DC | PRN
  Filled 2015-10-30: qty 2

## 2015-10-30 MED ORDER — LACTATED RINGERS IV SOLN: 500.0000 mL | Freq: Once | INTRAVENOUS | Status: DC

## 2015-10-30 MED ORDER — FLEET ENEMA 7-19 GM/118ML RE ENEM: 1.0000 | ENEMA | RECTAL | Status: DC | PRN

## 2015-10-30 MED ORDER — LIDOCAINE HCL (PF) 1 % IJ SOLN
INTRAMUSCULAR | Status: DC | PRN
  Administered 2015-10-30 (×2): 4 mL

## 2015-10-30 MED ORDER — IBUPROFEN 600 MG PO TABS
600.0000 mg | ORAL_TABLET | Freq: Four times a day (QID) | ORAL | Status: DC
  Administered 2015-10-30 – 2015-11-01 (×7): 600 mg via ORAL
  Filled 2015-10-30 (×7): qty 1

## 2015-10-30 MED ORDER — TETANUS-DIPHTH-ACELL PERTUSSIS 5-2.5-18.5 LF-MCG/0.5 IM SUSP
0.5000 mL | Freq: Once | INTRAMUSCULAR | Status: AC
  Administered 2015-10-31: 0.5 mL via INTRAMUSCULAR

## 2015-10-30 MED ORDER — ACETAMINOPHEN 325 MG PO TABS: 650.0000 mg | ORAL_TABLET | ORAL | Status: DC | PRN

## 2015-10-30 MED ORDER — LACTATED RINGERS IV SOLN
500.0000 mL | INTRAVENOUS | Status: DC | PRN
  Administered 2015-10-30: 500 mL via INTRAVENOUS

## 2015-10-30 MED ORDER — OXYTOCIN BOLUS FROM INFUSION
500.0000 mL | INTRAVENOUS | Status: DC
  Administered 2015-10-30: 500 mL via INTRAVENOUS

## 2015-10-30 MED ORDER — PHENYLEPHRINE 40 MCG/ML (10ML) SYRINGE FOR IV PUSH (FOR BLOOD PRESSURE SUPPORT)
80.0000 ug | PREFILLED_SYRINGE | INTRAVENOUS | Status: DC | PRN
  Filled 2015-10-30: qty 5
  Filled 2015-10-30: qty 20

## 2015-10-30 MED ORDER — ONDANSETRON HCL 4 MG PO TABS: 4.0000 mg | ORAL_TABLET | ORAL | Status: DC | PRN

## 2015-10-30 MED ORDER — TERBUTALINE SULFATE 1 MG/ML IJ SOLN
0.2500 mg | Freq: Once | INTRAMUSCULAR | Status: DC | PRN
  Filled 2015-10-30: qty 1

## 2015-10-30 MED ORDER — DIPHENHYDRAMINE HCL 25 MG PO CAPS: 25.0000 mg | ORAL_CAPSULE | Freq: Four times a day (QID) | ORAL | Status: DC | PRN

## 2015-10-30 MED ORDER — OXYCODONE-ACETAMINOPHEN 5-325 MG PO TABS: 1.0000 | ORAL_TABLET | ORAL | Status: DC | PRN

## 2015-10-30 MED ORDER — DIPHENHYDRAMINE HCL 50 MG/ML IJ SOLN: 12.5000 mg | INTRAMUSCULAR | Status: DC | PRN

## 2015-10-30 MED ORDER — WITCH HAZEL-GLYCERIN EX PADS: 1.0000 "application " | MEDICATED_PAD | CUTANEOUS | Status: DC | PRN

## 2015-10-30 MED ORDER — ONDANSETRON HCL 4 MG/2ML IJ SOLN: 4.0000 mg | Freq: Four times a day (QID) | INTRAMUSCULAR | Status: DC | PRN

## 2015-10-30 MED ORDER — BENZOCAINE-MENTHOL 20-0.5 % EX AERO
1.0000 "application " | INHALATION_SPRAY | CUTANEOUS | Status: DC | PRN
  Administered 2015-10-30: 1 via TOPICAL
  Filled 2015-10-30: qty 56

## 2015-10-30 MED ORDER — ONDANSETRON HCL 4 MG/2ML IJ SOLN: 4.0000 mg | INTRAMUSCULAR | Status: DC | PRN

## 2015-10-30 MED ORDER — LIDOCAINE HCL (PF) 1 % IJ SOLN
30.0000 mL | INTRAMUSCULAR | Status: DC | PRN
  Filled 2015-10-30: qty 30

## 2015-10-30 MED ORDER — FENTANYL 2.5 MCG/ML BUPIVACAINE 1/10 % EPIDURAL INFUSION (WH - ANES)
14.0000 mL/h | INTRAMUSCULAR | Status: DC | PRN
  Administered 2015-10-30 (×2): 14 mL/h via EPIDURAL
  Filled 2015-10-30: qty 125

## 2015-10-30 MED ORDER — SIMETHICONE 80 MG PO CHEW: 80.0000 mg | CHEWABLE_TABLET | ORAL | Status: DC | PRN

## 2015-10-30 MED ORDER — CITRIC ACID-SODIUM CITRATE 334-500 MG/5ML PO SOLN: 30.0000 mL | ORAL | Status: DC | PRN

## 2015-10-30 MED ORDER — LACTATED RINGERS IV SOLN
1.0000 m[IU]/min | INTRAVENOUS | Status: DC
  Administered 2015-10-30: 2 m[IU]/min via INTRAVENOUS

## 2015-10-30 MED ORDER — DIBUCAINE 1 % RE OINT: 1.0000 "application " | TOPICAL_OINTMENT | RECTAL | Status: DC | PRN

## 2015-10-30 MED ORDER — PRENATAL MULTIVITAMIN CH
1.0000 | ORAL_TABLET | Freq: Every day | ORAL | Status: DC
  Administered 2015-10-31 – 2015-11-01 (×2): 1 via ORAL
  Filled 2015-10-30 (×2): qty 1

## 2015-10-30 MED ORDER — LACTATED RINGERS IV SOLN
2.5000 [IU]/h | INTRAVENOUS | Status: DC
  Filled 2015-10-30: qty 4

## 2015-10-30 MED ORDER — SENNOSIDES-DOCUSATE SODIUM 8.6-50 MG PO TABS
2.0000 | ORAL_TABLET | ORAL | Status: DC
  Administered 2015-10-30 – 2015-10-31 (×2): 2 via ORAL
  Filled 2015-10-30 (×2): qty 2

## 2015-10-30 MED ORDER — LACTATED RINGERS IV SOLN
INTRAVENOUS | Status: DC
  Administered 2015-10-30 (×2): via INTRAVENOUS

## 2015-10-30 MED ORDER — PHENYLEPHRINE 40 MCG/ML (10ML) SYRINGE FOR IV PUSH (FOR BLOOD PRESSURE SUPPORT)
80.0000 ug | PREFILLED_SYRINGE | INTRAVENOUS | Status: DC | PRN
  Filled 2015-10-30: qty 5

## 2015-10-30 NOTE — Anesthesia Postprocedure Evaluation (Signed)
Anesthesia Post Note  Patient: Jordan SewerSarah Preston  Procedure(s) Performed: * No procedures listed *  Patient location during evaluation: Mother Baby Anesthesia Type: Epidural Level of consciousness: awake Pain management: pain level controlled Vital Signs Assessment: post-procedure vital signs reviewed and stable Respiratory status: spontaneous breathing Cardiovascular status: stable Postop Assessment: no headache, no backache, epidural receding, patient able to bend at knees, no signs of nausea or vomiting and adequate PO intake Anesthetic complications: no    Last Vitals:  Filed Vitals:   10/30/15 1948 10/30/15 2021  BP: 122/79 117/81  Pulse: 94 83  Temp:  36.9 C  Resp:      Last Pain:  Filed Vitals:   10/30/15 2038  PainSc: 1                  [name]

## 2015-10-30 NOTE — Progress Notes (Signed)
Jordan SewerSarah Preston is a 30 y.o. G2P1001 at 7163w6d by  admitted for induction of labor due to gestational hypertension.  Subjective: Doing well; uncomfortable with contractions  Objective: BP 128/104 mmHg  Pulse 80  Temp(Src) 98.5 F (36.9 C) (Oral)  Resp 20  LMP 01/21/2015 (Exact Date)    B/p 141/78  FHT:  FHR: 145 bpm, variability: moderate,  accelerations:  Present,  decelerations:  Absent UC:   regular, every 2-4 minutes SVE:   Dilation: 4 Effacement (%): 60 Station: -2 Exam by:: garcia  Labs: Lab Results  Component Value Date   WBC 8.5 10/30/2015   HGB 11.0* 10/30/2015   HCT 32.1* 10/30/2015   MCV 86.3 10/30/2015   PLT 138* 10/30/2015    Assessment / Plan: Induction of labor due to gestational hypertension,  progressing well on pitocin  Labor: Progressing on Pitocin, will continue to increase then AROM Preeclampsia:  b/p stable;  Fetal Wellbeing:  Category I Pain Control:  Epidural I/D:  gbs negative Anticipated MOD:  NSVD  Jordan Preston 10/30/2015, 4:01 PM

## 2015-10-30 NOTE — Anesthesia Procedure Notes (Signed)
Epidural Patient location during procedure: OB  Staffing Anesthesiologist: Coltrane Tugwell Performed by: anesthesiologist   Preanesthetic Checklist Completed: patient identified, site marked, surgical consent, pre-op evaluation, timeout performed, IV checked, risks and benefits discussed and monitors and equipment checked  Epidural Patient position: sitting Prep: site prepped and draped and DuraPrep Patient monitoring: continuous pulse ox and blood pressure Approach: midline Location: L3-L4 Injection technique: LOR saline  Needle:  Needle type: Tuohy  Needle gauge: 17 G Needle length: 9 cm and 9 Needle insertion depth: 5 cm cm Catheter type: closed end flexible Catheter size: 19 Gauge Catheter at skin depth: 10 cm Test dose: negative  Assessment Events: blood not aspirated, injection not painful, no injection resistance, negative IV test and no paresthesia  Additional Notes Patient identified. Risks/Benefits/Options discussed with patient including but not limited to bleeding, infection, nerve damage, paralysis, failed block, incomplete pain control, headache, blood pressure changes, nausea, vomiting, reactions to medication both or allergic, itching and postpartum back pain. Confirmed with bedside nurse the patient's most recent platelet count. Confirmed with patient that they are not currently taking any anticoagulation, have any bleeding history or any family history of bleeding disorders. Patient expressed understanding and wished to proceed. All questions were answered. Sterile technique was used throughout the entire procedure. Please see nursing notes for vital signs. Test dose was given through epidural catheter and negative prior to continuing to dose epidural or start infusion. Warning signs of high block given to the patient including shortness of breath, tingling/numbness in hands, complete motor block, or any concerning symptoms with instructions to call for help. Patient was  given instructions on fall risk and not to get out of bed. All questions and concerns addressed with instructions to call with any issues or inadequate analgesia.      

## 2015-10-30 NOTE — Anesthesia Preprocedure Evaluation (Addendum)
Anesthesia Evaluation  Patient identified by MRN, date of birth, ID band Patient awake    Reviewed: Allergy & Precautions, NPO status , Patient's Chart, lab work & pertinent test results  History of Anesthesia Complications Negative for: history of anesthetic complications  Airway Mallampati: II  TM Distance: >3 FB Neck ROM: Full    Dental no notable dental hx. (+) Dental Advisory Given   Pulmonary neg pulmonary ROS,    Pulmonary exam normal breath sounds clear to auscultation       Cardiovascular hypertension, Normal cardiovascular exam Rhythm:Regular Rate:Normal     Neuro/Psych negative neurological ROS  negative psych ROS   GI/Hepatic negative GI ROS, Neg liver ROS,   Endo/Other  negative endocrine ROS  Renal/GU negative Renal ROS  negative genitourinary   Musculoskeletal negative musculoskeletal ROS (+)   Abdominal   Peds negative pediatric ROS (+)  Hematology negative hematology ROS (+)   Anesthesia Other Findings   Reproductive/Obstetrics (+) Pregnancy                             Anesthesia Physical Anesthesia Plan  ASA: II  Anesthesia Plan: Epidural   Post-op Pain Management:    Induction:   Airway Management Planned:   Additional Equipment:   Intra-op Plan:   Post-operative Plan:   Informed Consent: I have reviewed the patients History and Physical, chart, labs and discussed the procedure including the risks, benefits and alternatives for the proposed anesthesia with the patient or authorized representative who has indicated his/her understanding and acceptance.   Dental advisory given  Plan Discussed with: CRNA  Anesthesia Plan Comments:         Anesthesia Quick Evaluation  

## 2015-10-30 NOTE — Progress Notes (Signed)
Per Dr. Adrian BlackwaterStinson, pt is to report to MAU for induction. MAU staff aware. Pt understands to go home to gather hospital bags and report to MAU.

## 2015-10-30 NOTE — Progress Notes (Signed)
Jordan SewerSarah Preston is a 30 y.o. G2P1001 at 7261w6d by  admitted for induction of labor due to gestational hypertension.  Subjective:  Doing well Objective: BP 126/80 mmHg  Pulse 85  Temp(Src) 98.5 F (36.9 C) (Oral)  Resp 16  LMP 01/21/2015 (Exact Date)      FHT:  FHR: 145 bpm, variability: moderate,  accelerations:  Present,  decelerations:  Absent UC:   regular, every 4-5  minutes SVE:   Dilation: 4 Effacement (%): 60 Station: -2 Exam by:: garcia  Labs: Lab Results  Component Value Date   WBC 8.5 10/30/2015   HGB 11.0* 10/30/2015   HCT 32.1* 10/30/2015   MCV 86.3 10/30/2015   PLT 138* 10/30/2015    Assessment / Plan: gestational hypertension  Labor: PItocin induction Preeclampsia:  no severe features Fetal Wellbeing:  Category I Pain Control:  Labor support without medications I/D:  gbs negative Anticipated MOD:  NSVD  Clemmons,Lori Grissett 10/30/2015, 1:19 PM

## 2015-10-30 NOTE — H&P (Signed)
Jordan Preston is a 30 y.o. G2P1001 at 2538+6 female presenting for IOL for gHTN.  She has newly diagnosed gHTN without headache, visual disturbances, RUQ pain, SOB, or significant edema.  She has had occasional Braxton-Hicks but no regular contractions.  She denies LOF or VB and has good FM.  [facility] Labs  Dating LMP sono Blood type: O/POS/-- (11/18 0933) O pos  Genetic Screen declines Antibody:NEG (11/18 0933)Neg  Anatomic US UTD dilation--needs f/u at 28-30 wks Rubella: 2.06 (11/18 0933)immune  GTT Early:  77           Third trimester: 98 RPR: NON REAC (02/10 0950) NR  Flu vaccine 03/29/15 HBsAg: NEGATIVE (11/18 0933) Neg  TDaP vaccine  declines                                            HIV: NONREACTIVE (02/10 0950) NR  Baby Food breast                                           GBS: NEGATIVE  Contraception Condoms then vasectomy Pap: Normal pap in Oct 2015 at [facility]  Circumcision girl   Pediatrician Jordan Preston - Dr. Dario Preston   Support Person Husband      History OB History    Gravida Para Term Preterm AB TAB SAB Ectopic Multiple Living   2 1 1       1       Obstetric Comments   Pre-eclampsia with previous child, was on mag in hospital for delivery.     Past Medical History  Diagnosis Date  . Anemia   . Pregnancy induced hypertension    Past Surgical History  Procedure Laterality Date  . No past surgeries     Family History: family history includes Cancer (age of onset: 8360) in her maternal aunt; Hypertension in her father and mother. Social History:  reports that she has never smoked. She has never used smokeless tobacco. She reports that she does not drink alcohol or use illicit drugs.   Prenatal Transfer Tool  Maternal Diabetes: No Genetic Screening: Declined Maternal Ultrasounds/Referrals: Normal Fetal Ultrasounds or other Referrals:  Other: fetal renal abnormalities with ureteral dilation and calyceal dilation. [facility] consult  recommended. Maternal Substance Abuse:  No Significant Maternal Medications:  None Significant Maternal Lab Results:  Lab values include: Group B Strep negative Other Comments:  None  ROS  No fevers/chills No chest pain/shortness of breath No nausea/vomiting No dysuria/hematuria No rash  Dilation: 4 Effacement (%): 60 Station: -2 Exam by:: Jordan Preston Blood pressure 126/80, pulse 85, temperature 98.5 F (36.9 C), temperature source Oral, resp. rate 16, last menstrual period 01/21/2015, unknown if currently breastfeeding. Exam Physical Exam  Gen: alert, in NAD HEENT: NCAT, normal conjunctivae, moist oral mucosa Chest: normal WOB, lungs CTAB CV: normal rate and regular rhythm, normal S1 and S2, no m/r/g Abd: nontender Ext: no pedal edema Skin: no rashes or lesions noted Neuro: no clonus Psych: cooperative, appropriate affect  FHTs: 145bpm, moderate variability, +accels, -decels  Prenatal labs: ABO, Rh: --/--/O POS, O POS (04/21 1328) Antibody: NEG (04/21 1328) Rubella: 2.06 (11/18 0933) RPR: Non Reactive (04/21 1326)  HBsAg: NEGATIVE (11/18 0933)  HIV: NONREACTIVE (02/10 0950)  GBS: neg  Assessment/Plan: Mrs. Jordan Preston is a 29yo G2P1001 at 38+6 who presents for IOL for gHTN.  # Labor: SVE 4/60/-2.  Will start pitocin. # FWB: Category 1 tracing # Pain: Desires epidural when further along # MOC: POPs # MOF: Breast # Rh+/Rubella I/GBS- # gHTN: Mild range BPs, no s/s of pre-eclampsia.  Will check CMP/CBC/UPC. # Anticipate SVD   Jordan Preston 10/30/2015, 1:02 PM    Jordan Preston attestation:  I have seen and examined this patient; I agree with above documentation in the Resident's note.    Jordan Preston, Jordan Preston 4:22 PM

## 2015-10-30 NOTE — Progress Notes (Signed)
Pt here today for BP check - current complaints are nausea. Denies headache and vision changes. BP elevated in office today. Called physician on call, Dr. Adrian BlackwaterStinson. He will advise treatment plan.

## 2015-10-31 LAB — RPR: RPR Ser Ql: NONREACTIVE

## 2015-10-31 NOTE — Progress Notes (Signed)
Post Partum Day 1 Subjective:  Jordan Preston is a 30 y.o. [id number] 6013w6d s/p NSVD PPD#1.  No acute events overnight.  Pt denies problems with ambulating, voiding or po intake.  She denies nausea or vomiting.  Pain is well controlled.  She has had flatus. She has not had bowel movement.  Lochia Minimal.  Plan for birth control is oral progesterone-only contraceptive.  Method of Feeding: breast  Objective: Blood pressure 126/85, pulse 71, temperature 98.2 F (36.8 C), temperature source Oral, resp. rate 18, last menstrual period 01/21/2015, SpO2 100 %, unknown if currently breastfeeding.  Physical Exam:  General: alert, cooperative and no distress Lochia:normal flow Chest: CTAB Heart: RRR no m/r/g Abdomen: +BS, soft, nontender,  Uterine Fundus: firm, below umbilicus DVT Evaluation: No evidence of DVT seen on physical exam. Extremities: trace edema   Recent Labs  10/30/15 1243  HGB 11.0*  HCT 32.1*    Assessment/Plan:  ASSESSMENT: Jordan Preston is a 30 y.o. [id number] 6913w6d s/p NSVD @ 18:15 last night. Patient doing well with no concerns at this time. Wishes to be discharged tomorrow.   Plan for discharge tomorrow   LOS: 1 day   Samuel H SwazilandJordan 10/31/2015, 7:44 AM   OB fellow attestation Post Partum Day 1 I have seen and examined this patient and agree with above documentation in the resident's note.   Jordan Preston is a 30 y.o. G2P2002 s/p NSVD.  Pt denies problems with ambulating, voiding or po intake. Pain is well controlled.  Plan for birth control is oral progesterone-only contraceptive.  Method of Feeding: breastfeeding  PE:  BP 126/85 mmHg  Pulse 71  Temp(Src) 98.2 F (36.8 C) (Oral)  Resp 18  SpO2 100%  LMP 01/21/2015 (Exact Date)  Breastfeeding? Unknown Gen: well appearing Heart: reg rate Lungs: normal WOB Fundus firm Ext: soft, no pain, no edema  Plan for discharge: PPD#1, home tomorrow Routine care  Federico FlakeKimberly Niles Zandon Talton, MD 8:45 AM

## 2015-10-31 NOTE — Lactation Note (Signed)
This note was copied from a baby's chart. Lactation Consultation Note:  Mother latched infant on the (R) breast independently. When I arrived in the room mother states that she feels the infant is chewing and bitting.  obsereved frequent swallows. I had mother to do breast compression. Infant repeated had good burst of swallows.  Mother is still concerned that infant has an upper lip tie and a tongue tie.  Assess for both and observed that infant has a upper lip tie with a tiny amt of blood as if a tear at the aveolar ridge . Infant has a slight high palate with a tight anterior tongue tie. Mother was given hand out with specialist names for referral at her request. Mother states that she want to successfully breastfeed this infant without pumping this time. [facility] was [facility] when milk comes to volume and do a pre and post weight assessment. Mother has a small crack on the (R) nipple . Comfort gels given.   Patient Name: Girl Jeanie SewerSarah Georgiades ZOXWR'UToday's Date: 10/31/2015 Reason for consult: Follow-up assessment   Maternal Data    Feeding Feeding Type: Breast Fed Length of feed: 25 min  LATCH Score/Interventions Latch: Grasps breast easily, tongue down, lips flanged, rhythmical sucking.  Audible Swallowing: Spontaneous and intermittent  Type of Nipple: Everted at rest and after stimulation  Comfort (Breast/Nipple): Filling, red/small blisters or bruises, mild/mod discomfort  Problem noted: Filling;Cracked, bleeding, blisters, bruises Interventions  (Cracked/bleeding/bruising/blister): Expressed breast milk to nipple  Hold (Positioning): Assistance needed to correctly position infant at breast and maintain latch. Intervention(s): Support Pillows;Position options  LATCH Score: 8  Lactation Tools Discussed/Used     Consult Status Consult Status: Follow-up Date: 10/31/15 Follow-up type: In-patient    Stevan BornKendrick, Quinita Kostelecky [facility] 10/31/2015, 12:02 PM

## 2015-10-31 NOTE — Lactation Note (Signed)
This note was copied from a baby's chart. Lactation Consultation Note Experienced mom has 1 1/30 yr old and BF him for 1 week then changed to exclusively pumping and bottle feeding for 1 yr. Mom stated she just stopped in Aug. 2016. Only trouble she had was a clogged duck once. Mom stopped Bf and changed to pumping d/t the painful latching and BF. Baby had an upper lip tie mom stated. Mom would like baby to be checked for any altered issues in oral assessment that might would cause BF issues or pain. Baby was sleeping and mom didn't want her disturbed, but would like someone to check it today. States occasionally has a painful latch. Reminded of chin tug and positioning. Mom encouraged to feed baby 8-12 times/24 hours and with feeding cues. Referred to Baby and Me Book in Breastfeeding section Pg. 22-23 for position options and Proper latch demonstration.[facility] brochure given w/resources, support groups and LC services. Patient Name: Girl Jeanie SewerSarah Newborn YNWGN'FToday's Date: 10/31/2015 Reason for consult: Initial assessment   Maternal Data Has patient been taught Hand Expression?: Yes Does the patient have breastfeeding experience prior to this delivery?: Yes  Feeding Feeding Type: Breast Fed Length of feed: 20 min  LATCH Score/Interventions Latch: Too sleepy or reluctant, no latch achieved, no sucking elicited.  Audible Swallowing: None  Type of Nipple: Everted at rest and after stimulation  Comfort (Breast/Nipple): Soft / non-tender     Hold (Positioning): No assistance needed to correctly position infant at breast. Intervention(s): Skin to skin;Position options;Support Pillows;Breastfeeding basics reviewed  LATCH Score: 6  Lactation Tools Discussed/Used     Consult Status Consult Status: Follow-up Date: 10/31/15 Follow-up type: In-patient    Jonty Morrical, Diamond NickelLAURA G 10/31/2015, 5:22 AM

## 2015-11-01 MED ORDER — IBUPROFEN 600 MG PO TABS
600.0000 mg | ORAL_TABLET | Freq: Four times a day (QID) | ORAL | Status: DC
Start: 2015-11-01 — End: 2016-11-25

## 2015-11-01 MED ORDER — ACETAMINOPHEN 325 MG PO TABS: 650.0000 mg | ORAL_TABLET | ORAL | Status: DC | PRN

## 2015-11-01 MED ORDER — NORETHINDRONE 0.35 MG PO TABS: 1.0000 | ORAL_TABLET | Freq: Every day | ORAL | Status: DC

## 2015-11-01 NOTE — Discharge Instructions (Signed)

## 2015-11-01 NOTE — Discharge Summary (Signed)
OB Discharge Summary     Patient Name: Jordan Preston DOB: 08-Sep-1985 MRN: [id number]  Date of admission: 10/30/2015 Delivering MD: Trish Fountain E   Date of discharge: 11/01/2015  Admitting diagnosis: INDUCTION Intrauterine pregnancy: [redacted]w[redacted]d     Secondary diagnosis:  Active Problems:   History of gestational hypertension   Pregnancy   NSVD (normal spontaneous vaginal delivery)  Additional problems: none     Discharge diagnosis: Term Pregnancy Delivered                                                                                                Post partum procedures: none  Augmentation: Pitocin  Complications: None  Hospital course:  Induction of Labor With Vaginal Delivery   30 y.o. yo G2P2002 at [redacted]w[redacted]d was admitted to the hospital 10/30/2015 for induction of labor.  Indication for induction: Gestational hypertension.  Patient had an uncomplicated labor course as follows: Patient presented with favorable cervix and thus was started on pitocin.  She progressed rapidly to complete and had SROM followed by uncomplicated SVD.   Membrane Rupture Time/Date: 5:40 PM ,10/30/2015   Intrapartum Procedures: Episiotomy: None [1]                                         Lacerations:  2nd degree [3];Perineal [11]  Patient had delivery of a Viable infant.  Information for the patient's newborn:  Christasia, Angeletti Girl Ahria [[id number]]  Delivery Method: Vaginal, Spontaneous Delivery (Filed from Delivery Summary)   10/30/2015  Details of delivery can be found in separate delivery note.  Patient had a routine postpartum course. Patient is discharged home 11/01/2015.   Physical exam  Filed Vitals:   10/31/15 0120 10/31/15 0524 10/31/15 1700 11/01/15 0551  BP: 114/76 126/85 128/90 119/75  Pulse: 74 71 77   Temp: 98.4 F (36.9 C) 98.2 F (36.8 C) 98.2 F (36.8 C) 98.1 F (36.7 C)  TempSrc: Oral Oral Oral Oral  Resp: SpO2:   98%    General: alert, cooperative and no distress Lochia:  appropriate Uterine Fundus: firm Incision: N/A DVT Evaluation: No evidence of DVT seen on physical exam. Labs: Lab Results  Component Value Date   WBC 8.5 10/30/2015   HGB 11.0* 10/30/2015   HCT 32.1* 10/30/2015   MCV 86.3 10/30/2015   PLT 138* 10/30/2015   CMP Latest Ref Rng 10/30/2015  Glucose 65 - 99 mg/dL 76  BUN 6 - 20 mg/dL 7  Creatinine 2.13 - 0.86 mg/dL 5.78  Sodium 469 - 629 mmol/L 136  Potassium 3.5 - 5.1 mmol/L 4.0  Chloride 101 - 111 mmol/L 108  CO2 22 - 32 mmol/L 19(L)  Calcium 8.9 - 10.3 mg/dL 9.1  Total Protein 6.5 - 8.1 g/dL 7.0  Total Bilirubin 0.3 - 1.2 mg/dL 0.6  Alkaline Phos 38 - 126 U/L 162(H)  AST 15 - 41 U/L 17  ALT 14 - 54 U/L 10(L)    Discharge instruction: per After Visit Summary and "  Baby and Me Booklet".  After visit meds:    Medication List    STOP taking these medications        calcium carbonate 500 MG chewable tablet  Commonly known as:  TUMS - dosed in mg elemental calcium      TAKE these medications        acetaminophen 325 MG tablet  Commonly known as:  TYLENOL  Take 2 tablets (650 mg total) by mouth every 4 (four) hours as needed (for pain scale < 4ORtemperature>/=100.5 F).     ibuprofen 600 MG tablet  Commonly known as:  ADVIL,MOTRIN  Take 1 tablet (600 mg total) by mouth every 6 (six) hours.     norethindrone 0.35 MG tablet  Commonly known as:  MICRONOR,CAMILA,ERRIN  Take 1 tablet (0.35 mg total) by mouth daily.     prenatal multivitamin Tabs tablet  Take 1 tablet by mouth daily.        Diet: routine diet  Activity: Advance as tolerated. Pelvic rest for 6 weeks.   Outpatient follow up:6 weeks Follow up Appt:No future appointments. Follow up Visit:No Follow-up on file.  Postpartum contraception: Progesterone only pills  Newborn Data: Live born female  Birth Weight: 8 lb 0.2 oz (3634 g) APGAR: 9, 9  Baby Feeding: Breast Disposition:home with mother   11/01/2015 Caesar ChestnutKelly E Garcia, MD   OB fellow  attestation I have seen and examined this patient and agree with above documentation in the resident's note.   Jeanie SewerSarah Courser is a 30 y.o. [id number] s/p NSVD after IOL for gHTN.   Pain is well controlled.  Plan for birth control is oral progesterone-only contraceptive.  Method of Feeding: breast  PE:  BP 119/75 mmHg  Pulse 77  Temp(Src) 98.1 F (36.7 C) (Oral)  Resp 19  SpO2 98%  LMP 01/21/2015 (Exact Date)  Breastfeeding? Unknown Gen: well appearing Heart: reg rate Lungs: normal WOB Fundus firm Ext: soft, no pain, no edema  No results for input(s): HGB, HCT in the last 72 hours.  Plan: discharge today - postpartum care discussed - f/u clinic in 6 weeks for postpartum visit  Federico FlakeKimberly Niles Kmari Brian, MD 9:52 AM  Attending: Tinnie Gensanya Pratt MD

## 2015-11-01 NOTE — Lactation Note (Signed)
This note was copied from a baby's chart. Lactation Consultation Note  Patient Name: Jordan Preston ZOXWR'UToday's Date: 11/01/2015 Reason for consult: Follow-up assessment   With this mom of a term baby, now 4140 hours old, and at 8% weight los last evening. Adequate wet and dirty diapers. On exam, mom's milk is transitioning in. Mom and dad aware of baby having upper lip and tongue tie, and are planning having frenotomy done as soon as possible. Mom advised to pump to protect her milk supply, and supplement Jordan Preston with her EBM . Mom has a personal DEP at home. I observed Jordan Preston latching - it appears deep, but she is on and off the breast. Mom is already getting pink, tender nipples and cracks at her nipple bases. She has comfort gels and has been instructed in their use and care. I feel Jordan Preston is transferring milk, but mom's nipples are getting sore, and I do not want them to lead to an infection. Mom is fine with using a bottle to supplement. An o/p lactation appointment made for 5/4. Thursday, at 2;30 pm. Parents hope to have frenulums clipped by then. Mom knows to call for questions/concerns.    Maternal Data    Feeding Feeding Type: Breast Fed Length of feed: 20 min  LATCH Score/Interventions Latch: Repeated attempts needed to sustain latch, nipple held in mouth throughout feeding, stimulation needed to elicit sucking reflex. Intervention(s): Adjust position;Assist with latch  Audible Swallowing: A few with stimulation  Type of Nipple: Everted at rest and after stimulation  Comfort (Breast/Nipple): Soft / non-tender  Interventions (Filling): Firm support  Hold (Positioning): Assistance needed to correctly position infant at breast and maintain latch.  LATCH Score: 7  Lactation Tools Discussed/Used     Consult Status Consult Status: Complete Date: 11/09/15 Follow-up type: Out-patient    Jordan Preston, Jordan Preston 11/01/2015, 10:42 AM

## 2015-11-03 ENCOUNTER — Encounter: Payer: Managed Care, Other (non HMO) | Admitting: Family Medicine

## 2015-11-09 ENCOUNTER — Ambulatory Visit (HOSPITAL_COMMUNITY)
Admit: 2015-11-09 | Discharge: 2015-11-09 | Disposition: A | Discharge status: Home / Self Care | Payer: Managed Care, Other (non HMO) | Attending: Family Medicine | Admitting: Family Medicine

## 2015-11-09 NOTE — Lactation Note (Signed)
Lactation Consult  Mother's reason for visit:  F/U from tongue tie in hospital - still shallow latch , not flaring lips.  Visit Type: Feeding assessment  Appointment Notes: tongue tie , left message to confirm for 5/4  Consult:  Initial Lactation Consultant:  Kathrin Greathouse  ________________________________________________________________________ Baby's Name: Jordan Preston Date of Birth: 10/30/2015 Pediatrician: Dr. Rosanne Ashing  Gender: female Gestational Age: [redacted]w[redacted]d (At Birth) Birth Weight: 8 lb 0.2 oz (3634 g) Weight at Discharge: Weight: 7 lb 6 oz (3345 g)Date of Discharge: 11/01/2015 [facility] Weights   10/30/15 1812 11/01/15 0020  Weight: 8 lb 0.2 oz (3634 g) 7 lb 6 oz (3345 g)   [facility] 7-11 oz 11/07/2015 Location:Pediatrician's office Weight today: 3494 g , 7-11.2 oz    _______________________________________________________________________  Mother's Name: Jordan Preston Type of delivery:  Vaginal Delivery -  Breastfeeding Experience:  This is moms 2nd baby - ( swallow latch - tongue tie , hurt to latch , sore and scabbed nipples  And I wasn't going to go through the same that I did with 1st baby where I ended up pumping and bottle feeding for 12 months And only breast fed 2 weeks , and fed him from a bottle.  Maternal Medical Conditions:  Pregnancy induced hypertension - per mom no further tx needed , B/P ok  Maternal Medications:  PNV   ________________________________________________________________________  Breastfeeding History (Post Discharge)  Frequency of breastfeeding:  Every 2 hours or so during the day , 3-4 at night  Duration of feeding:  8-12 x's a day -  15 mins each side usually   Supplementing: None   Pumping: per mom has DEBP if needed ( [employer] ), but hasn't had to pump  Infant Intake and Output Assessment  Voids:8-9 n 24 hrs.  Color:  Clear yellow Stools:   6-7  In 24 hrs.Yellow  ________________________________________________________________________  Maternal Breast Assessment  Breast:  Full Nipple:  Erect Pain level:  0 Pain interventions:  Expressed breast milk  _______________________________________________________________________ Feeding Assessment/Evaluation  Initial feeding assessment:  Infant's oral assessment:  Variance see note below - LC impression   Positioning:  Laid back Right breast  LATCH documentation:  Latch:  2 = Grasps breast easily, tongue down, lips flanged, rhythmical sucking.  Audible swallowing:  2 = Spontaneous and intermittent  Type of nipple:  2 = Everted at rest and after stimulation  Comfort (Breast/Nipple):  1 = Filling, red/small blisters or bruises, mild/mod discomfort  Hold (Positioning):  2 = No assistance needed to correctly position infant at breast  LATCH score:  9   Attached assessment:  Deep  Lips flanged:  No. easily flipped lips to flanged position   Lips untucked:  Yes.    Suck assessment:  Nutritive  Tools:  None  Instructed on use and cleaning of tool:  Not needed   Pre-feed weight:  3494 g , 7-11.2 oz  Post-feed weight: 3544 g , 7-13.0 oz  Amount transferred:  50 ml  Amount supplemented: none   Additional Feeding Assessment -   Infant's oral assessment:  Variance see LC note below   Positioning:  Cross cradle Left breast  LATCH documentation:  Latch:  2 = Grasps breast easily, tongue down, lips flanged, rhythmical sucking.  Audible swallowing:  2 = Spontaneous and intermittent  Type of nipple:  2 = Everted at rest and after stimulation  Comfort (Breast/Nipple):  1 = Filling, red/small blisters or bruises, mild/mod  discomfort  Hold (Positioning):  1 = Assistance needed to correctly position infant at breast and maintain latch  LATCH score:  8  Attached assessment:  Deep  Lips flanged:  No. - LC encouraged mom to flip upper lip to flanged position.   Lips untucked:   Yes.    Suck assessment:  Nutritive  Tools:  None  Instructed on use and cleaning of tool:  No.  Pre-feed weight:  3544 g , 7-13.0 oz  Post-feed weight: 3610 g , 7-15.3 oz  Amount transferred:  66 ml  Amount supplemented:  None    Total amount pumped post feed:  Did not post pump - breast softened   Total amount transferred: 116 ml ( Excellent ) at 4710 days old  Total supplement given:  None   Lactation Impression: Baby - oral variance short labial frenulum above the gum line ( per mom Dr. Margaretha Sheffieldraper felt it wasn't necessary to laser)  Actually stretches well with oral exam ( LC's gloved finger ) and at the breast , but skin area above the lips after baby  Released appeared reddened. Also lips upper and lower have the coblestone appearance, ( indication of short labial frenulum )  LC recommended - may also be due to the breast being so full . Recommended expressing 10 -15 ml off 1st to sandwich areola  to make it easier to keep  Baby's upper lip flanged. If still noticing the appearance of the lips and concerned F/U with Dr. Margaretha Sheffieldraper at F/U appt.  Laser wound site under tongue present - and appears to be healing, granulating well ( tissue )  - good mobility of tongue.  Per mom does tongue exercises prescribed by Dr. Margaretha Sheffieldraper 3-4 times a day  Baby latched in 2 different positions on each breast , mom comfortable with both latches , and depth achieved baby fed 15 -20 mins  And took 116 ml off total both breast ( remarkable for a a 10 day old ) , also for 3 days S/P posterior Frenotomy.  Per mom right nipple pinky red ( no breakdown ) left normal color. LC encouraged breast milk or coconut oil  Per mom my sore nipples have improved greatly since Monday.   Lactation Plan of Care:  Per mom F/U with [facility] Monday May 5/8  Per mom F/U with Dr. Dario GuardianPudlo 5/10  Per mom with Dr. Margaretha Sheffieldraper - when mom makes the appt.  Praised mom for her efforts breast feeding.  Goals - Protect establishing  milk  supply  Get Maggie to the creamy fatty milk consistently - therefore weight will increase more steadily  Watch for non - nutritive hanging out feeding patterns  Skin to skin feedings until the baby can stay awake for a feeding and weight is steadily increasing , back up to birth weight  Remember - recommended to switch between at least 2 different breast feeding positions - enhance milk supply  Prior to latch - express 10 -15 ml off 1st breast ( save milk ) and make sure the areola is compressible like a thinner sandwich  so the baby can obtain the depth easier,aim the nipple towards the baby's nose ,tap upper lip wait for a wide open mouth and latch  with breast compressions until swallows and then intermittent.  LC encouraged mom to attend the Monday support group for weight checks after these 2 upcoming weight checks this coming week.  F/U with LC office PRN if needed.

## 2016-11-25 ENCOUNTER — Ambulatory Visit (INDEPENDENT_AMBULATORY_CARE_PROVIDER_SITE_OTHER): Payer: Managed Care, Other (non HMO) | Admitting: Obstetrics and Gynecology

## 2016-11-25 ENCOUNTER — Encounter: Payer: Self-pay | Admitting: Obstetrics and Gynecology

## 2016-11-25 VITALS — BP 143/85 | HR 84 | Resp 18 | Ht 66.0 in | Wt 131.0 lb

## 2016-11-25 DIAGNOSIS — Z1151 Encounter for screening for human papillomavirus (HPV): Secondary | ICD-10-CM | POA: Diagnosis not present

## 2016-11-25 DIAGNOSIS — I10 Essential (primary) hypertension: Secondary | ICD-10-CM | POA: Insufficient documentation

## 2016-11-25 DIAGNOSIS — N368 Other specified disorders of urethra: Secondary | ICD-10-CM | POA: Insufficient documentation

## 2016-11-25 DIAGNOSIS — Z01419 Encounter for gynecological examination (general) (routine) without abnormal findings: Secondary | ICD-10-CM

## 2016-11-25 DIAGNOSIS — N941 Unspecified dyspareunia: Secondary | ICD-10-CM | POA: Insufficient documentation

## 2016-11-25 NOTE — Progress Notes (Signed)
Obstetrics and Gynecology Annual Patient Evaluation  Appointment Date: 11/25/2016  [facility]  Primary Care Provider: None  Chief Complaint:  Chief Complaint  Patient presents with  . Gynecologic Exam  Dyspareunia Pelvic pain  History of Present Illness: Jordan SewerSarah Preston is a 31 y.o. Caucasian G2P2002 (Patient's last menstrual period was 11/14/2016.), seen for the above chief complaint. Her past medical history is significant for nothing  Pelvic pain: feels like low belly and feels like period cramps but not associated with period. Feels more RLQ, no radiation. Last about two weeks ago and last about a week and prior to that was about two weeks ago. No nausea, vomiting, dysuria, fevers, chills  Dyspareunia: since birth of last child; notices it with deeper penetration  No breast s/s, fevers, chills, chest pain, nausea, vomiting, dysuria, hematuria, vaginal itching, vaginal dryness, SUI or OAB, blood in BMs. She also denies recurrent UTIs, sensation that she isn't completely emptying her bladder or any dribbling before or after voiding.   Review of Systems: as noted in the History of Present Illness.  Past Medical History:  Past Medical History:  Diagnosis Date  . Anemia   . Pregnancy induced hypertension     Past Surgical History:  Past Surgical History:  Procedure Laterality Date  . NO PAST SURGERIES      Past Obstetrical History:  OB History  Gravida Para Term Preterm AB Living  2 2 2     2   SAB TAB Ectopic Multiple Live Births        0 2    # Outcome Date GA Lbr Len/2nd Weight Sex Delivery Anes PTL Lv  2 Term 10/30/15 708w6d 05:26 / 00:21 8 lb 0.2 oz (3.634 kg) F Vag-Spont EPI  LIV  1 Term 03/17/14 2626w6d / 01:35 6 lb 5.6 oz (2.88 kg) M Vag-Spont EPI  LIV    Obstetric Comments  Pre-eclampsia with previous child, was on mag in hospital for delivery.    Past Gynecological History: As per HPI. Still breastfeeding. Using  micronor Periods: started back about two months ago. About every 35-40 d No. history of abnormal pap smears (last pap smear: unknown  Social History:  Social History   Social History  . Marital status: Married    Spouse name: N/A  . Number of children: N/A  . Years of education: N/A   Occupational History  . Not on file.   Social History Main Topics  . Smoking status: Never Smoker  . Smokeless tobacco: Never Used  . Alcohol use No  . Drug use: No  . Sexual activity: Yes    Birth control/ protection: Pill   Other Topics Concern  . Not on file   Social History Narrative  . No narrative on file    Family History:  Family History  Problem Relation Age of Onset  . Hypertension Mother   . Hypertension Father   . Cancer Maternal Aunt 60       breast    Medications Jordan Preston had no medications administered during this visit. Current Outpatient Prescriptions  Medication Sig Dispense Refill  . norethindrone (MICRONOR,CAMILA,ERRIN) 0.35 MG tablet Take 1 tablet (0.35 mg total) by mouth daily. 1 Package 11   No current facility-administered medications for this visit.     Allergies Patient has no known allergies.   Physical Exam:  BP (!) 143/85 (BP Location: Left Arm, Patient Position: Sitting, Cuff Size: Normal)   Pulse 84   Resp 18  Ht 5\' 6"  (1.676 m)   Wt 131 lb (59.4 kg)   LMP 11/14/2016   Breastfeeding? Yes   BMI 21.14 kg/m  Body mass index is 21.14 kg/m.  General appearance: Well nourished, well developed female in no acute distress.  Neck:  Supple, normal appearance, and no thyromegaly  Cardiovascular: normal s1 and s2.  No murmurs, rubs or gallops. Respiratory:  Clear to auscultation bilateral. Normal respiratory effort Abdomen: positive bowel sounds and no masses, hernias; diffusely non tender to palpation, non distended Breasts: breasts appear normal, no suspicious masses, no skin or nipple changes or axillary nodes, and normal  palpation. Neuro/Psych:  Normal mood and affect.  Skin:  Warm and dry.  Lymphatic:  No inguinal lymphadenopathy.   Pelvic exam: is not limited by body habitus EGBUS: 1.5 x 1.5 cm cyst beneath the skin that's slightly to the right of the urethra. Nttp, feels fluctuant, no erythema or skin changes. Everything else normal Vagina: normal Cervix: normal, nttp Uterus: small, nttp, mobile Adnexa: negative Rectovaginal: deferred.   Laboratory: None  Radiology: None  Assessment: Pt stable  Plan:  1. Hypertension, unspecified type D/w her that she could have HTN. Will get TSH, bmp, cbc, Mg, lipid panel and likely have her come back for BP check  2. Dyspareunia in female She states taht it feels slightly ttp with intercourse but it's different than the deeper penetration. No obvious prolapse of the cervix/apical area. D/w her that pelvic floor PT may be useful but will get u/s and UCx, pap smear first.   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 She said she had it in the past but then it went away on it's one and it came back around the time her periods started coming back d/w her consideration for removal given it is somewhat symptomatic.   4. GYN Patient would like to stay on micronor for now.    RTC PRN, will call pt back after u/s is done.   Cornelia Copa MD Attending [facility])

## 2016-11-25 NOTE — Progress Notes (Signed)
Pt is here today for annual exam, c/o intermittent pelvic pain and pain with intercourse.

## 2016-11-26 LAB — BASIC METABOLIC PANEL
BUN / CREAT RATIO: 15 (ref 9–23)
BUN: 10 mg/dL (ref 6–20)
CO2: 25 mmol/L (ref 18–29)
Calcium: 9.2 mg/dL (ref 8.7–10.2)
Chloride: 105 mmol/L (ref 96–106)
Creatinine, Ser: 0.65 mg/dL (ref 0.57–1.00)
GFR, EST AFRICAN AMERICAN: 138 mL/min/{1.73_m2} (ref >59)
GFR, EST NON AFRICAN AMERICAN: 120 mL/min/{1.73_m2} (ref >59)
Glucose: 95 mg/dL (ref 65–99)
POTASSIUM: 4 mmol/L (ref 3.5–5.2)
SODIUM: 141 mmol/L (ref 134–144)

## 2016-11-26 LAB — CBC
HEMATOCRIT: 38.7 % (ref 34.0–46.6)
Hemoglobin: 12.8 g/dL (ref 11.1–15.9)
MCH: 28.4 pg (ref 26.6–33.0)
MCHC: 33.1 g/dL (ref 31.5–35.7)
MCV: 86 fL (ref 79–97)
Platelets: 210 10*3/uL (ref 150–379)
RBC: 4.51 x10E6/uL (ref 3.77–5.28)
RDW: 14.7 % (ref 12.3–15.4)
WBC: 5.9 10*3/uL (ref 3.4–10.8)

## 2016-11-26 LAB — LIPID PANEL WITH LDL/HDL RATIO
Cholesterol, Total: 198 mg/dL (ref 100–199)
HDL: 59 mg/dL (ref >39)
LDL CALC: 106 mg/dL — AB (ref 0–99)
LDl/HDL Ratio: 1.8 ratio (ref 0.0–3.2)
Triglycerides: 164 mg/dL — ABNORMAL HIGH (ref 0–149)
VLDL CHOLESTEROL CAL: 33 mg/dL (ref 5–40)

## 2016-11-26 LAB — TSH: TSH: 1.04 u[IU]/mL (ref 0.450–4.500)

## 2016-11-26 LAB — MAGNESIUM: Magnesium: 2.1 mg/dL (ref 1.6–2.3)

## 2016-11-27 LAB — CYTOLOGY - PAP
Diagnosis: NEGATIVE
HPV (WINDOPATH): NOT DETECTED

## 2016-11-27 LAB — URINE CULTURE: Organism ID, Bacteria: NO GROWTH

## 2016-11-28 ENCOUNTER — Ambulatory Visit: Payer: Managed Care, Other (non HMO) | Admitting: Obstetrics and Gynecology

## 2016-12-18 ENCOUNTER — Ambulatory Visit: Payer: Managed Care, Other (non HMO)

## 2016-12-27 ENCOUNTER — Other Ambulatory Visit (INDEPENDENT_AMBULATORY_CARE_PROVIDER_SITE_OTHER): Payer: Managed Care, Other (non HMO) | Admitting: *Deleted

## 2016-12-27 DIAGNOSIS — R300 Dysuria: Secondary | ICD-10-CM | POA: Diagnosis not present

## 2016-12-27 LAB — POCT URINALYSIS DIPSTICK
BILIRUBIN UA: NEGATIVE
Glucose, UA: NEGATIVE
KETONES UA: NEGATIVE
PH UA: 6 (ref 5.0–8.0)
Protein, UA: NEGATIVE
SPEC GRAV UA: 1.02 (ref 1.010–1.025)
Urobilinogen, UA: 0.2 E.U./dL

## 2016-12-27 MED ORDER — CEPHALEXIN 500 MG PO CAPS: 500.0000 mg | ORAL_CAPSULE | Freq: Two times a day (BID) | ORAL | 0 refills | Status: AC

## 2016-12-27 NOTE — Progress Notes (Signed)
SUBJECTIVE: Jordan SewerSarah Preston is a 31 y.o. female who complains of urinary frequency, urgency and dysuria x 7 days, with flank pain. Deies abnormal vaginal discharge or bleeding,fever or chills   OBJECTIVE: Appears well, in no apparent distress.  Vital signs are normal. Urine dipstick shows positive for WBC's, positive for RBC's, positive for nitrates and positive for leukocytes.    ASSESSMENT: Dysuria  PLAN: Treatment per orders.  Call or return to clinic prn if these symptoms worsen or fail to improve as anticipated.

## 2016-12-31 LAB — CULTURE, URINE COMPREHENSIVE

## 2017-01-06 ENCOUNTER — Ambulatory Visit: Payer: Managed Care, Other (non HMO)

## 2017-01-27 ENCOUNTER — Other Ambulatory Visit: Payer: Self-pay | Admitting: Family Medicine

## 2017-02-24 ENCOUNTER — Encounter: Payer: Self-pay | Admitting: Obstetrics and Gynecology

## 2017-03-11 ENCOUNTER — Encounter: Payer: Self-pay | Admitting: Obstetrics and Gynecology

## 2017-03-11 ENCOUNTER — Ambulatory Visit: Payer: Managed Care, Other (non HMO) | Admitting: Obstetrics and Gynecology

## 2017-03-11 DIAGNOSIS — N939 Abnormal uterine and vaginal bleeding, unspecified: Secondary | ICD-10-CM

## 2017-03-11 NOTE — Progress Notes (Signed)
Patient did not keep GYN appointment for 03/11/2017.  Jordan Preston, Jr MD Attending [facility])

## 2017-04-17 ENCOUNTER — Telehealth: Payer: Self-pay | Admitting: *Deleted

## 2017-04-17 ENCOUNTER — Encounter: Payer: Self-pay | Admitting: Obstetrics and Gynecology

## 2017-04-17 DIAGNOSIS — N898 Other specified noninflammatory disorders of vagina: Secondary | ICD-10-CM

## 2017-04-17 NOTE — Telephone Encounter (Signed)
Per Dr. Vergie Living - entered referral to  Dr. Doy Hutching with [facility]

## 2017-04-30 ENCOUNTER — Encounter: Payer: Self-pay | Admitting: Obstetrics and Gynecology

## 2017-05-05 ENCOUNTER — Telehealth: Payer: Self-pay | Admitting: Radiology

## 2017-05-05 NOTE — Telephone Encounter (Signed)
Left message on the cell phone voicemail explaining  that I contact Dr Terie PurserWeidner's office again about referral sent 04/17/17. They state that  They have received the fax, not sure why the patient hadn't been contacted. I called and explained patient to call the office to schedule her appointment and left phone number. Requested that patient call [email or web address] to confirm that she need get scheduled.

## 2017-06-18 IMAGING — US US MFM OB FOLLOW-UP
1 series · 13 of 28 positions shown · non-contrast
Comparison: none

[Series 2: us mfm ob follow-up · 46 acquisitions, 13 frames shown]
[im 2/46]
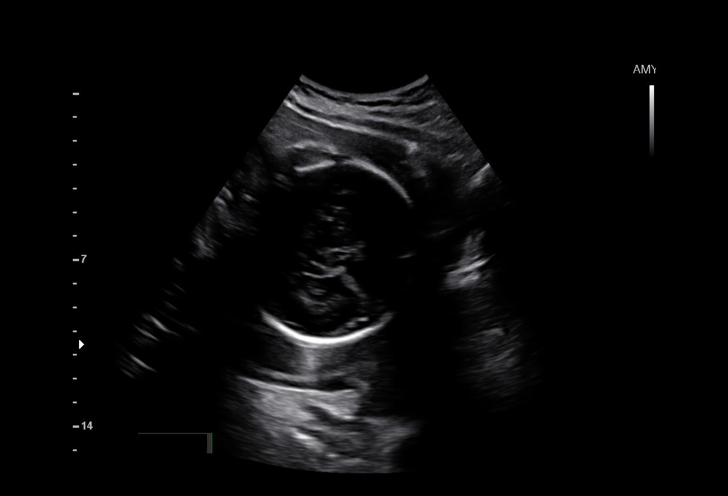
[im 6/46]
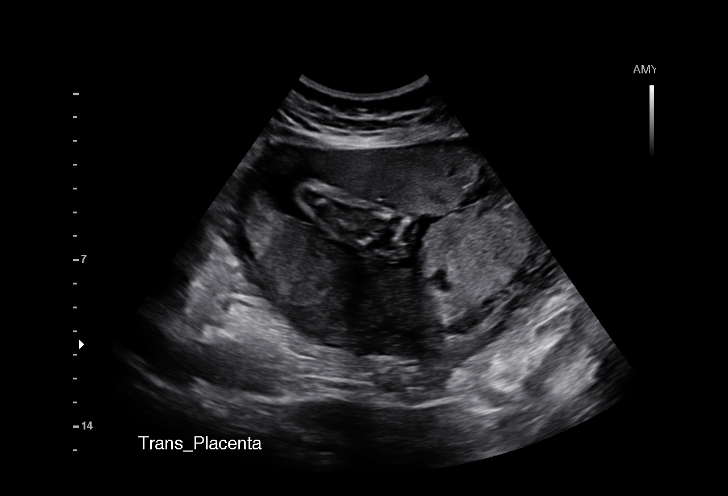
[im 9/46]
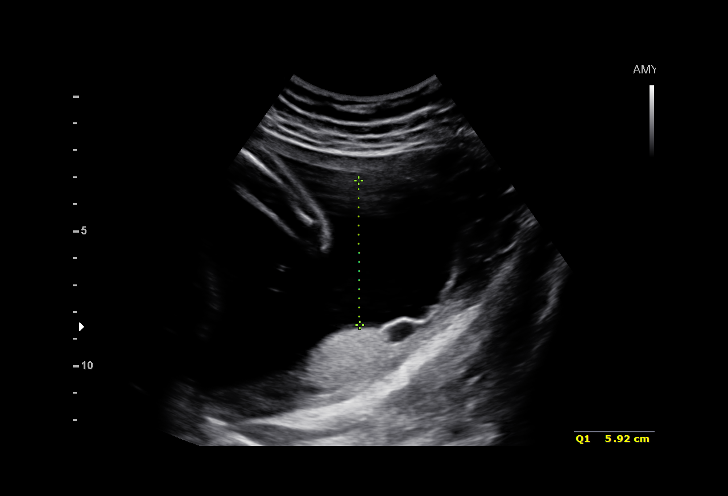
[im 12/46]
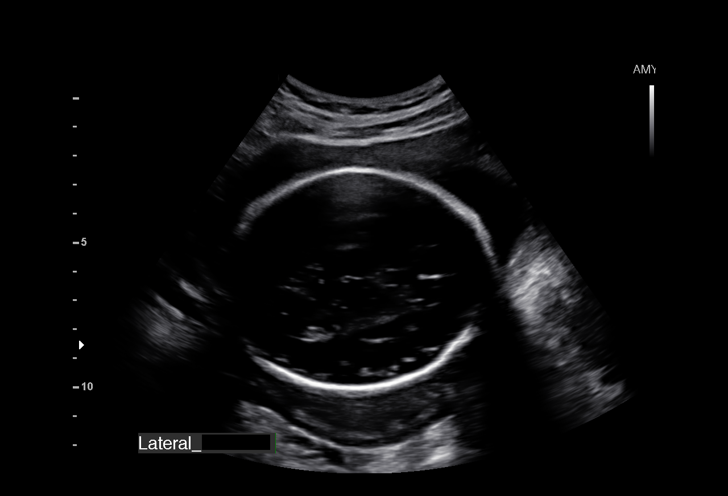
[im 16/46]
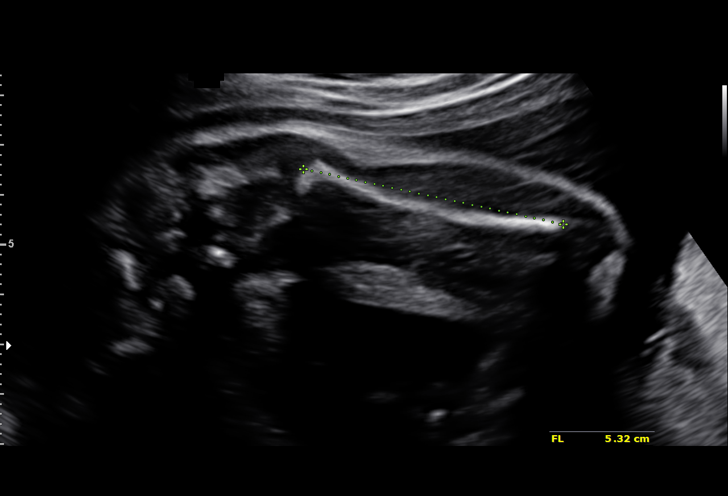
[im 19/46]
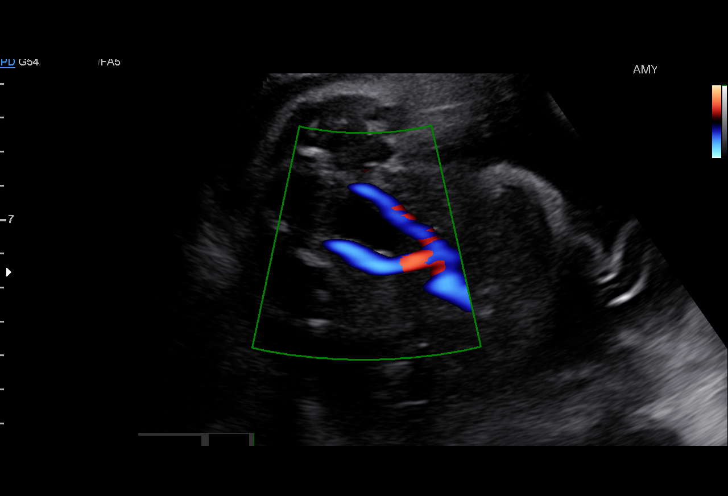
[im 24/46]
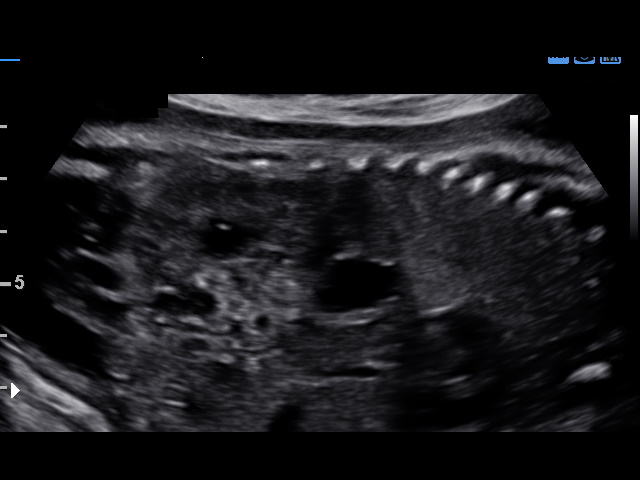
[im 27/46]
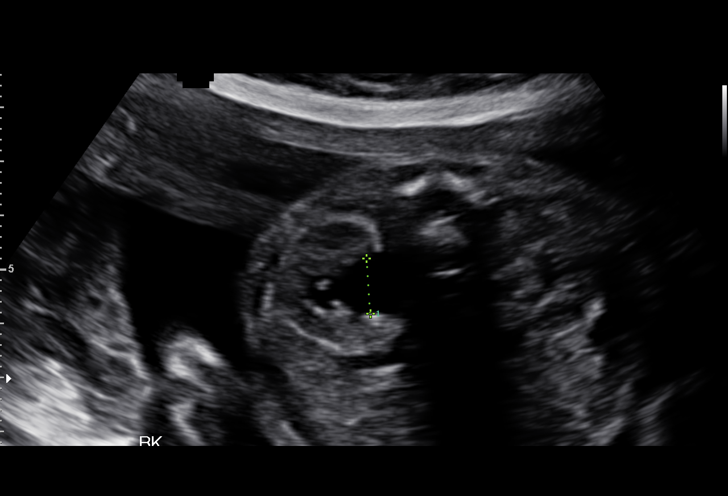
[im 31/46]
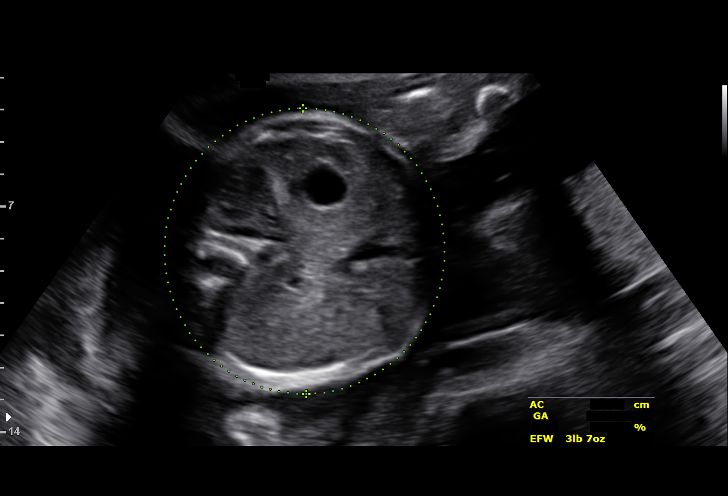
[im 34/46]
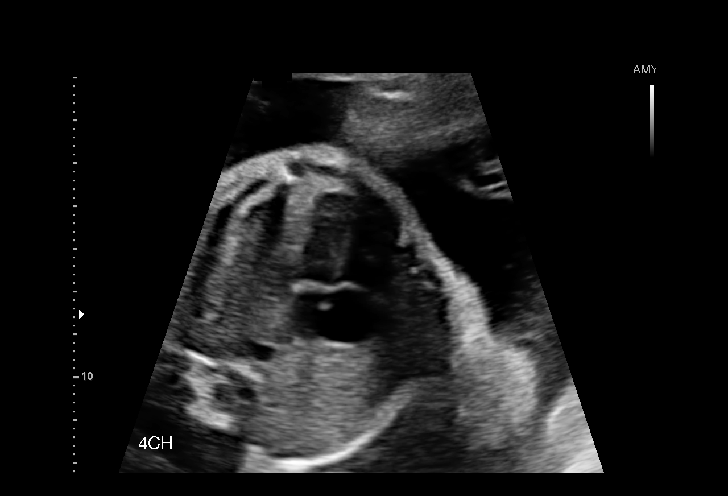
[im 37/46]
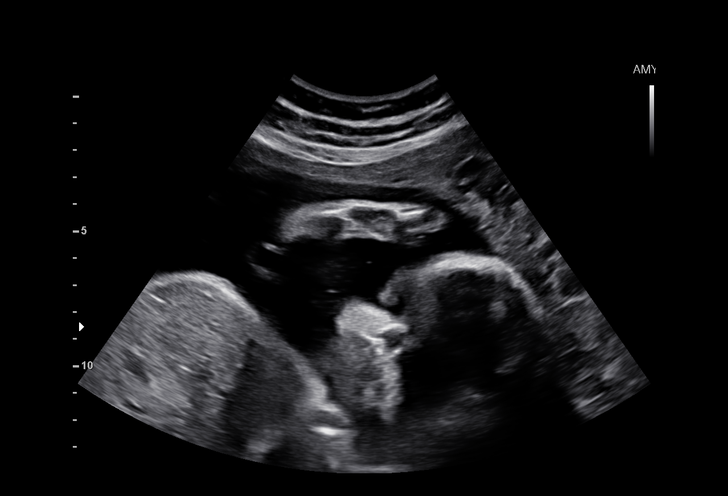
[im 41/46]
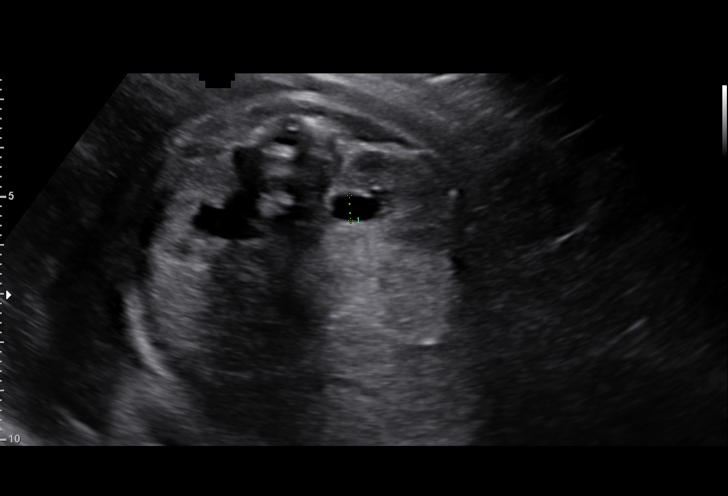
[im 44/46]
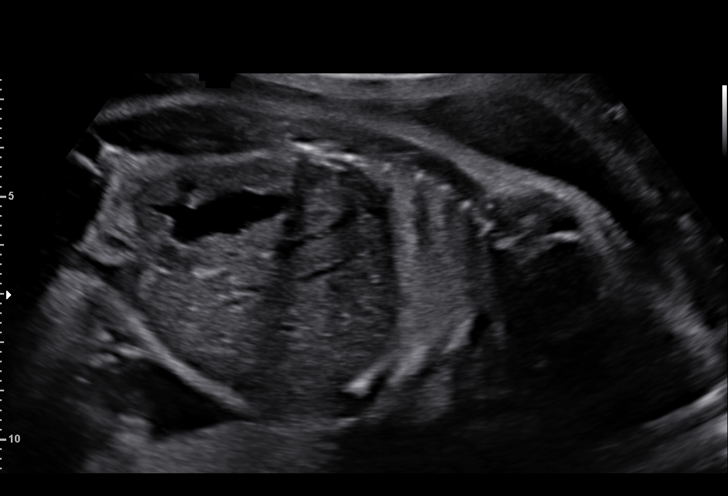

[13 of 28 positions shown; findings below may reference images not displayed]

pm)

Name:       AMIR MOHSEN NAJJAR                           Visit  08/18/2015 [DATE]
Date:

[REDACTED]

1  [place]              [id number]       [id number]     [id number]
Indications

Poor obstetric history: Previous
preeclampsia / eclampsia/gestational HTN
Poor obstetric history: Previous gestational
diabetes
28 weeks gestation of pregnancy
Fetal abnormality - other known or
suspected (renal pyelectasis)
Short interval between pregancies, 3rd
trimester
OB History

Height:        5'6"   Weight:   141        BMI:
Gravidity:     2         Term:  1        Prem:    0        SAB:   0
TOP:           0       Ectopic  0        Living:  1
:
Fetal Evaluation

Num Of Fetuses:      1
Fetal Heart          140
Rate(bpm):
Cardiac Activity:    Observed
Presentation:        Cephalic
Placenta:            Posterior, above cervical os
P. Cord Insertion:   Previously Visualized

Amniotic Fluid
AFI FV:      Subjectively within normal limits
AFI Sum:     20.51    cm      82  %Tile     Larg Pckt:    5.92   cm
RUQ:   5.92    cm    RLQ:   5.49    cm   LUQ:    5.35    cm   LLQ:    3.75   cm
Biometry

BPD:      74.9  mm     G. Age:   30w 0d                  CI:        75.72   %    70 - 86
FL/HC:      19.6   %    18.8 -
HC:      272.9  mm     G. Age:   29w 6d        60   %    HC/AC:      1.01        1.05 -
AC:      270.2  mm     G. Age:   31w 1d      > 97   %    FL/BPD      71.4   %    71 - 87
:
FL:       53.5  mm     G. Age:   28w 3d        34   %    FL/AC:      19.8   %    20 - 24
HUM:      46.6  mm     G. Age:   27w 3d        25   %

Est.        5645   gm    3 lb 5 oz      78   %
FW:
Gestational Age

LMP:           29w 6d        Date:  01/21/15                  EDD:   10/28/15
U/S Today:     29w 6d                                         EDD:   10/28/15
Best:          28w 3d    Det. By:   Early Ultrasound          EDD:   11/07/15
Anatomy

Cranium:          Previously seen        Aortic Arch:       Previously seen
Fetal Cavum:      Previously seen        Ductal Arch:       Previously seen
Ventricles:       Appears normal         Diaphragm:         Appears normal
Choroid Plexus:   Previously seen        Stomach:           Appears normal,
left sided
Cerebellum:       Previously seen        Abdomen:           Appears normal
Posterior         Previously seen        Abdominal          Previously seen
Fossa:                                   Wall:
Nuchal Fold:      Not applicable (>20    Cord Vessels:      Appears normal (3
wks GA)                                   vessel cord)
Face:             Orbits and profile     Kidneys:           Bilateral UTD Rt
previously seen                           11 mm Lt 5 m
Lips:             Previously seen        Bladder:           Appears normal
Fetal Thoracic:   Appears normal         Spine:             Previously seen
Heart:            Appears normal         Upper              Previously seen
(4CH, axis, and        Extremities:
situs)
RVOT:             Previously seen        Lower              Previously seen
Extremities:
LVOT:             Previously seen

Other:   Heels and 5th digit previously seen.
Cervix Uterus Adnexa

Cervix
Not visualized (advanced GA >66wks)
Impression

SIUP at 28+3 weeks
Left UTD A2-3; dilated renal pelvis ([DATE] mms) plus central
and peripheral calyces
All other interval fetal anatomy was seen and appeared
normal
Normal amniotic fluid volume
Appropriate interval growth with EFW at the 78th %tile; AC
> 97th %tile

The US findings were shared with Ms. FIDAIL ANH.  The
implications of a dilated collecting system were discussed
in detail. Will arrange a prenatal consultation with peds
urology.
Recommendations

Follow-up ultrasound in 6 weeks to reassess kidneys
Routine prenatal care and delivery

## 2017-09-23 ENCOUNTER — Encounter: Payer: Self-pay | Admitting: Radiology
